# Patient Record
Sex: Female | Born: 1970 | Race: White | Hispanic: No | Marital: Married | Smoking: Former smoker
Health system: Southern US, Community
[De-identification: ages and names within clinical notes are randomized; demographics above are authoritative.]

## PROBLEM LIST (undated history)

## (undated) DIAGNOSIS — D179 Benign lipomatous neoplasm, unspecified: Secondary | ICD-10-CM

## (undated) DIAGNOSIS — M549 Dorsalgia, unspecified: Secondary | ICD-10-CM

## (undated) DIAGNOSIS — M1990 Unspecified osteoarthritis, unspecified site: Secondary | ICD-10-CM

## (undated) DIAGNOSIS — N200 Calculus of kidney: Secondary | ICD-10-CM

## (undated) DIAGNOSIS — N809 Endometriosis, unspecified: Secondary | ICD-10-CM

## (undated) DIAGNOSIS — D735 Infarction of spleen: Secondary | ICD-10-CM

## (undated) DIAGNOSIS — G8929 Other chronic pain: Secondary | ICD-10-CM

## (undated) DIAGNOSIS — K5710 Diverticulosis of small intestine without perforation or abscess without bleeding: Secondary | ICD-10-CM

## (undated) HISTORY — PX: OTHER SURGICAL HISTORY: SHX169

## (undated) HISTORY — PX: TUBAL LIGATION: SHX77

## (undated) HISTORY — DX: Calculus of kidney: N20.0

## (undated) HISTORY — PX: CHOLECYSTECTOMY: SHX55

## (undated) HISTORY — DX: Benign lipomatous neoplasm, unspecified: D17.9

## (undated) HISTORY — DX: Endometriosis, unspecified: N80.9

## (undated) HISTORY — DX: Diverticulosis of small intestine without perforation or abscess without bleeding: K57.10

## (undated) HISTORY — DX: Dorsalgia, unspecified: M54.9

## (undated) HISTORY — DX: Infarction of spleen: D73.5

## (undated) HISTORY — DX: Unspecified osteoarthritis, unspecified site: M19.90

## (undated) HISTORY — DX: Other chronic pain: G89.29

---

## 2003-06-09 ENCOUNTER — Encounter
Admission: RE | Admit: 2003-06-09 | Discharge: 2003-09-07 | Payer: Self-pay | Admitting: Physical Medicine and Rehabilitation

## 2003-08-10 ENCOUNTER — Ambulatory Visit (HOSPITAL_COMMUNITY): Admission: RE | Admit: 2003-08-10 | Discharge: 2003-08-10 | Payer: Self-pay | Admitting: Neurosurgery

## 2010-05-28 ENCOUNTER — Encounter: Payer: Self-pay | Admitting: Neurosurgery

## 2011-10-27 ENCOUNTER — Other Ambulatory Visit: Payer: Self-pay

## 2011-11-08 ENCOUNTER — Telehealth: Payer: Self-pay | Admitting: Oncology

## 2011-11-08 NOTE — Telephone Encounter (Signed)
S/w rhe pt and she is aware of her new pt appt on 11/16/2011 with dr Gaylyn Rong

## 2011-11-16 ENCOUNTER — Ambulatory Visit: Payer: Self-pay

## 2011-11-16 ENCOUNTER — Encounter: Payer: Self-pay | Admitting: Oncology

## 2011-11-16 ENCOUNTER — Other Ambulatory Visit (HOSPITAL_BASED_OUTPATIENT_CLINIC_OR_DEPARTMENT_OTHER): Payer: BC Managed Care – PPO | Admitting: Lab

## 2011-11-16 ENCOUNTER — Telehealth: Payer: Self-pay | Admitting: Oncology

## 2011-11-16 ENCOUNTER — Ambulatory Visit (HOSPITAL_BASED_OUTPATIENT_CLINIC_OR_DEPARTMENT_OTHER): Payer: BC Managed Care – PPO | Admitting: Oncology

## 2011-11-16 VITALS — BP 117/77 | HR 86 | Temp 98.3°F | Ht 61.0 in | Wt 182.8 lb

## 2011-11-16 DIAGNOSIS — D179 Benign lipomatous neoplasm, unspecified: Secondary | ICD-10-CM | POA: Insufficient documentation

## 2011-11-16 DIAGNOSIS — D7389 Other diseases of spleen: Secondary | ICD-10-CM

## 2011-11-16 DIAGNOSIS — D735 Infarction of spleen: Secondary | ICD-10-CM | POA: Insufficient documentation

## 2011-11-16 DIAGNOSIS — G8929 Other chronic pain: Secondary | ICD-10-CM

## 2011-11-16 DIAGNOSIS — M549 Dorsalgia, unspecified: Secondary | ICD-10-CM

## 2011-11-16 LAB — CBC WITH DIFFERENTIAL/PLATELET
Basophils Absolute: 0.1 10*3/uL (ref 0.0–0.1)
Eosinophils Absolute: 0.2 10*3/uL (ref 0.0–0.5)
HGB: 14.4 g/dL (ref 11.6–15.9)
MCV: 92.6 fL (ref 79.5–101.0)
NEUT#: 4.6 10*3/uL (ref 1.5–6.5)
RDW: 14 % (ref 11.2–14.5)
lymph#: 2.6 10*3/uL (ref 0.9–3.3)

## 2011-11-16 LAB — MORPHOLOGY: PLT EST: ADEQUATE

## 2011-11-16 MED ORDER — MORPHINE SULFATE ER 15 MG PO TBCR: 15.0000 mg | EXTENDED_RELEASE_TABLET | Freq: Two times a day (BID) | ORAL | Status: DC

## 2011-11-16 NOTE — Progress Notes (Signed)
[facility]  Telephone:([phone] Fax:(336) [phone]     INITIAL HEMATOLOGY CONSULTATION    Referral MD:   Dr. Fara Chute, M.D.  Reason for Referral: splenic infarct    HPI: [name] is a 41 year-old woman.  She was in usual state of health until Father's day 10/22/2011.  She developed transient acute spasm sensation of her substernal area that lasted for about a few minutes that resolved spontaneously.  She then developed progressive pain in the left flank.  Pain was crampy; sharp, severe 10/10; making her unable to perform household chores; persistent, no relation with eating/bowel movement/ activity.  She also had left shoulder pain which has seen resolved.  She waited until 6/ 21/2013 when the pain was unbearable.  She presented to [facility].  Her presenting WBC was 9.3; Hgb 14.3; plt 248.  Normal LFT, amylase and lipase.  Her INR was normal at 0.8.  Her D-dimer was elevated at 1.61 ug/mL with reference range for that lab was <0.49.  Her presenting EKG which I reviewed myself personally was normal sinus rhythm with rate at 68 bpm; normal intervals, normal axis, no ST-T wave changes.   CT angio did not show PE; however, it showed splenic infarct which was confirmed on CT abdomen and MRI abdomen.  I had the chance to review the CT and MRI abdomen myself.  There was splenic infarct in the posterior inferior pole of the spleen.  There was no splenemegaly, abdominal/pelvic adenopathy.  Liver, kidney, and pancreas appeared normal.  She was admitted overnight for pain controled.  She was evaluated by Dr. Erskine Speed who recommended conservative management.  She was discharged home on Percocet with follow up with Dr. Gabriel Cirri.  Her PCP Dr. Neita Carp kindly referred her to the Cancer Center for evaluation of potential hematologic cause of splenic infarct.   Andrea Gonzalez presented today in clinic with her husband and mother.  She still has 8/10 LUQ as  described above.  She needs to take Percocet about 4-6 times daily.  Percocet does abate her pain but it does not last very long.  She has fatigue due to the pain.  She denied sedation, constipation.  Over the last few days, she has had some intermittent sharp pain in the RUQ which is similar in quality but much less in intensity and sporadic.  She denied history of ever having thrombotic complication.   Patient denies fever, anorexia, weight loss, headache, visual changes, confusion, drenching night sweats, palpable lymph node swelling, mucositis, odynophagia, dysphagia, nausea vomiting, jaundice, chest pain, palpitation, shortness of breath, dyspnea on exertion, productive cough, gum bleeding, epistaxis, hematemesis, hemoptysis, abdominal swelling, early satiety, melena, hematochezia, hematuria, skin rash, spontaneous bleeding, joint swelling, joint pain, heat or cold intolerance, bowel bladder incontinence, back pain, focal motor weakness, paresthesia, depression, suicidal or homocidal ideation, feeling hopelessness.     Past Medical History  Diagnosis Date  . Lipoma     base of spine; on observation  . OA (osteoarthritis)   . Chronic back pain   . 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     Past Surgical History  Procedure Date  . Cholecystectomy   . Uterine ablation   . Right salpingo-oophorectomy     due to ectopic pregnancy; and hemrrhage  :   CURRENT MEDS: Current Outpatient Prescriptions  Medication Sig Dispense Refill  . oxyCODONE-acetaminophen (PERCOCET) 5-325 MG per tablet Take 1 tablet by mouth every 4 (four) hours as needed.  Up to max 10 per day      . zolpidem (AMBIEN) 10 MG tablet Take 10 mg by mouth at bedtime as needed.      Marland Kitchen morphine (MS CONTIN) 15 MG 12 hr tablet Take 1 tablet (15 mg total) by mouth 2 (two) times daily.  60 tablet  0      No Known Allergies:  History reviewed. No pertinent family history.:  History   Social History  . Marital  Status: Married    Spouse Name: N/A    Number of Children: 2  . Years of Education: N/A   Occupational History  .      self-employed   Social History Main Topics  . Smoking status: Not on file  . Smokeless tobacco: Not on file  . Alcohol Use: Not on file  . Drug Use: Not on file  . Sexually Active: Not on file   Other Topics Concern  . Not on file   Social History Narrative  . No narrative on file  :  REVIEW OF SYSTEM:  The rest of the 14-point review of sytem was negative.   Exam: ECOG 1 due to LUQ abdominal pain.   General: well nourished woman, slight distressed appearing due to LUQ pain.  She did not appear toxic or lethargic.  Eyes:  no scleral icterus.  ENT:  There were no oropharyngeal lesions.  Neck was without thyromegaly.  Lymphatics:  Negative cervical, supraclavicular or axillary adenopathy.  Respiratory: lungs were clear bilaterally without wheezing or crackles.  Cardiovascular:  Regular rate and rhythm, S1/S2, without murmur, rub or gallop.  There was no pedal edema.  GI:  abdomen was soft, mildly obese, no fluid wave, tender to palpation of LUQ toward the flank. There was no rebound tenderness.  Muscoloskeletal:  no spinal tenderness of palpation of vertebral spine.  Skin exam was without echymosis, petichae.  Neuro exam was nonfocal.  Patient was able to get on and off exam table without assistance.  Gait was normal.  Patient was alerted and oriented.  Attention was good.   Language was appropriate.  Mood was normal without depression.  Speech was not pressured.  Thought content was not tangential.    LABS:  Lab Results  Component Value Date   WBC 7.7 11/16/2011   HGB 14.4 11/16/2011   HCT 42.4 11/16/2011   PLT 212 11/16/2011   GLUCOSE 112* 11/16/2011   ALT 19 11/16/2011   AST 15 11/16/2011   NA 139 11/16/2011   K 4.0 11/16/2011   CL 108 11/16/2011   CREATININE 0.85 11/16/2011   BUN 12 11/16/2011   CO2 20 11/16/2011    Blood smear review:   I personally reviewed  the patient's peripheral blood smear today.  There was isocytosis.  There was no peripheral blast.  There was no schistocytosis, spherocytosis, target cell, rouleaux formation, tear drop cell, sickle cell.  There was no giant platelets or platelet clumps.    Echocardiogram from [facility] on 11/10/2011 showed normal LVEF; however with e/a reversal.  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  There was minimal MR and TR. There was no thrombus.    ASSESSMENT AND PLAN:   1.  Spontaneous Splenic infarct:  Despite symptoms of persistent pain, Hgb is stable.  There is no evidence of splenic rupture today on clinical history, exam, and Hgb.    - Differential:  Concern for potential cardiac cause given interatrial aneurysm.  From hematology standpoint, I have low  clinical suspicion for myeloproliferative disease such as polycythema vera, essential thrombocytosis, acute leukemia, chronic leukemia given normal CBC on 10/27/2011 and today and also on smear review.  There is no constitutional symptoms, adenopathy, or finding on abdominal CT and MRI to suspect lymphoproliferative disease.   Imaging study did not show occult malignancy.  Blood smear review, her normal Hgb and MCV make it unlikely for sickle cell disease.  I cannot rule out thrombophilia including lupus anticoagulant given her age/gender.  I cannot rule out paroxysmal nocturnal hemoglobinuria (PNH) either.  - Work up:   I sent for thrombophilia panel (as this may influence our decision for lifelong anticoag if positive).  I sent for PNH flow cytometry.  If these hem work ups turn out negative, I strongly recommend Dr. Neita Carp to consider cardiac evaluation.  For her RUQ, I cannot rule out new infarct.  However, the pain is still transient in the RUQ now.  She has follow up repeat abdominal MRI in a few days as per order by Dr. Gabriel Cirri.  I advised her to present to local ER with RUQ quadrant pain worsens.  - Treatment:  She takes  Percocet up to 6 times daily.  I recommended addition of long acting pain med with MS Contin 15mg  PO BID.  She should take less Percocet for breakthrough since she now has MS Contin.  I advised her to watch out for sedation.  I advised her on bowel regimen.   I will hold off on anticoagulation at this time due to lack of thrombus on CT/MRI.  We will address anticoagulation at next visit if she has indication.  - Follow up:  In about 2-3 weeks to go over hem work up.    Thank you for this referral.     The length of time of the encounter was 60 minutes. More than 50% of time was spent counseling and coordination of care.

## 2011-11-16 NOTE — Progress Notes (Signed)
Patient came in today as a new patient with her husband and she has [employer].I did explain to them the financial assistance and co-pay assistance program we offer here,but her husband said that they should be [place] as far as financial assistance with there insurance.

## 2011-11-16 NOTE — Telephone Encounter (Signed)
appts made and printed for pt aom °

## 2011-11-16 NOTE — Patient Instructions (Addendum)
1.  Splenic infarct - Potential causes:  Cardiac (atrial fibrillation; blood clot in heart chamber(s); vs thrombophilia (abnormal level of clot-breaking thrombolysis enzymes) vs PNH (paroxysmal nocturnal hemoglobinuria.  Low chance of myeloproliferative disease (due to normal blood count).  - work up:  Cardiology with your primary care physician.  Hemology work up here for thrombophilia and PNH.    2.  Treatment: - Addition of MS Contin (long acting) 15mg  by mouth TWICE DAILY in addition to Percocet (short acting) - Start Colace or Senna (Stool softerners) once to twice daily to prevent pain-med induced constipation. - If you have no bowel movement every 3 days, take some laxative (such as Miralax, Milk of magnesia, Mag citrate, or enema).  3.  Follow up in about 2-3  weeks to go over repeat abdominal MRI and need for blood thinner if new infarct or pain not significantly improved.

## 2011-11-17 ENCOUNTER — Other Ambulatory Visit: Payer: Self-pay | Admitting: Oncology

## 2011-11-17 DIAGNOSIS — D735 Infarction of spleen: Secondary | ICD-10-CM

## 2011-11-20 LAB — BETA-2 GLYCOPROTEIN ANTIBODIES: Beta-2-Glycoprotein I IgA: 36 A Units — ABNORMAL HIGH (ref <20)

## 2011-11-20 LAB — COMPREHENSIVE METABOLIC PANEL
AST: 15 U/L (ref 0–37)
Albumin: 4.3 g/dL (ref 3.5–5.2)
Alkaline Phosphatase: 65 U/L (ref 39–117)
Potassium: 4 mEq/L (ref 3.5–5.3)
Sodium: 139 mEq/L (ref 135–145)
Total Protein: 6.6 g/dL (ref 6.0–8.3)

## 2011-11-20 LAB — LUPUS ANTICOAGULANT PANEL
DRVVT: 38.5 secs (ref <45.1)
Lupus Anticoagulant: NOT DETECTED
PTT Lupus Anticoagulant: 32.6 secs (ref 28.0–43.0)

## 2011-11-20 LAB — CARDIOLIPIN ANTIBODIES, IGG, IGM, IGA
Anticardiolipin IgG: 3 GPL U/mL (ref <23)
Anticardiolipin IgM: 3 MPL U/mL (ref <11)

## 2011-11-20 LAB — PNH PROFILE (-HIGH SENSITIVITY): Viability (%): 70 %

## 2011-11-20 LAB — ANTITHROMBIN III: AntiThromb III Func: 106 % (ref 76–126)

## 2011-11-20 LAB — FACTOR 5 LEIDEN

## 2011-11-20 LAB — PROTHROMBIN GENE MUTATION

## 2011-12-06 ENCOUNTER — Ambulatory Visit (HOSPITAL_BASED_OUTPATIENT_CLINIC_OR_DEPARTMENT_OTHER): Payer: BC Managed Care – PPO | Admitting: Oncology

## 2011-12-06 VITALS — BP 113/80 | HR 90 | Temp 98.0°F | Ht 61.0 in | Wt 184.0 lb

## 2011-12-06 DIAGNOSIS — I748 Embolism and thrombosis of other arteries: Secondary | ICD-10-CM

## 2011-12-06 DIAGNOSIS — I729 Aneurysm of unspecified site: Secondary | ICD-10-CM

## 2011-12-06 DIAGNOSIS — D735 Infarction of spleen: Secondary | ICD-10-CM

## 2011-12-06 NOTE — Progress Notes (Signed)
[facility]  Telephone:(336) [phone] Fax:(336) [phone]   OFFICE PROGRESS NOTE   Cc:  Estanislado Pandy, MD  DIAGNOSIS:  Splenic infarct x 1, unknown cause.   CURRENT THERAPY:  Conservative management   INTERVAL HISTORY: Andrea Gonzalez 41 y.o. female returns for regular follow up with her husband and mother to go over test result.  Her LUQ pain has significantly improved over the last two weeks.  She no longer takes MS Contin or Percocet.  She has gone back to work.  She still has vague but very mild LUQ and RUQ pain at time.  There has been on obvious inciting or alleviating factors for the abdominal pain.    Patient denies fever, anorexia, weight loss, fatigue, headache, visual changes, confusion, drenching night sweats, palpable lymph node swelling, mucositis, odynophagia, dysphagia, nausea vomiting, jaundice, chest pain, palpitation, shortness of breath, dyspnea on exertion, productive cough, gum bleeding, epistaxis, hematemesis, hemoptysis, abdominal swelling, early satiety, melena, hematochezia, diarrhea, constipation, hematuria, skin rash, spontaneous bleeding, joint swelling, joint pain, heat or cold intolerance, bowel bladder incontinence, back pain, focal motor weakness, paresthesia, depression.    Past Medical History  Diagnosis Date  . Lipoma     base of spine; on observation  . OA (osteoarthritis)   . Chronic back pain   . 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 Endometriosis     Past Surgical History  Procedure Date  . Cholecystectomy   . Uterine ablation   . Right salpingo-oophorectomy     due to ectopic pregnancy; and hemrrhage    Current Outpatient Prescriptions  Medication Sig Dispense Refill  . Multiple Vitamin (MULTIVITAMIN) tablet Take 1 tablet by mouth daily.      Marland Kitchen morphine (MS CONTIN) 15 MG 12 hr tablet Take 1 tablet (15 mg total) by mouth 2 (two) times daily.  60 tablet  0  . oxyCODONE-acetaminophen (PERCOCET) 5-325 MG per tablet Take 1 tablet  by mouth every 4 (four) hours as needed. Up to max 10 per day      . zolpidem (AMBIEN) 10 MG tablet Take 10 mg by mouth at bedtime as needed.        ALLERGIES:   has no known allergies.  REVIEW OF SYSTEMS:  The rest of the 14-point review of system was negative.   Filed Vitals:   12/06/11 1453  BP: 113/80  Pulse: 90  Temp: 98 F (36.7 C)   Wt Readings from Last 3 Encounters:  12/06/11 184 lb (83.462 kg)  11/16/11 182 lb 12.8 oz (82.918 kg)   ECOG Performance status: 0  PHYSICAL EXAMINATION:   General:  well-nourished woman, in no acute distress.  Eyes:  no scleral icterus.  ENT:  There were no oropharyngeal lesions.  Neck was without thyromegaly.  Lymphatics:  Negative cervical, supraclavicular or axillary adenopathy.  Respiratory: lungs were clear bilaterally without wheezing or crackles.  Cardiovascular:  Regular rate and rhythm, S1/S2, without murmur, rub or gallop.  There was no pedal edema.  GI:  abdomen was soft, flat, nondistended, without organomegaly.  There was mild discomfort in the LUQ with palpation without rebound tenderness.  Muscoloskeletal:  no spinal tenderness of palpation of vertebral spine.  Skin exam was without echymosis, petichae.  Neuro exam was nonfocal.  Patient was able to get on and off exam table without assistance.  Gait was normal.  Patient was alerted and oriented.  Attention was good.   Language was appropriate.  Mood was normal without depression.  Speech was  not pressured.  Thought content was not tangential.     LABORATORY/RADIOLOGY DATA:  Lab Results  Component Value Date   WBC 7.7 11/16/2011   HGB 14.4 11/16/2011   HCT 42.4 11/16/2011   PLT 212 11/16/2011   GLUCOSE 112* 11/16/2011   ALKPHOS 65 11/16/2011   ALT 19 11/16/2011   AST 15 11/16/2011   NA 139 11/16/2011   K 4.0 11/16/2011   CL 108 11/16/2011   CREATININE 0.85 11/16/2011   BUN 12 11/16/2011   CO2 20 11/16/2011    ASSESSMENT AND PLAN:   1.  Splenic infarct: - Etiology:  Unclear.  There  is no obvious hematological/thrombophila cause for her splenic infarct.  Her PNH testing was negative.  She does have slightly elevated beta-2 glycoprotein IgA at 36 units (ref is <20).  However, her lupus anticoagulant was negative. Thus, the slightly positive beta-2 glycoprotein IgA is most likely due to false positive from acute inflammation.  I have low clinical suspicion for lupus antibody.  There was no evidence of lymphoproliferative disease on previous imaging.  Her repeat abdominal MRI on 11/22/2011 showed scar formation in the splenic infarct and no new infarct.  I personally reviewed the images on CD rom and compared the latest MRI to the one in 10/2011.    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  Interatrial septal aneurysm is associated with embolic stroke.  It is rarely reported in case report to cause splenic infarct.  I strongly recommend to Dr. Neita Carp to refer patient to cardiology to determine if this needs to be addressed.   - Treatment:  No indication at this time for patient to be on chronic anticoagulation to be on long term anticoagulation.  However, if the interatrial septal aneurysm is addressed, and she develops recurrent splenic infarct, then she may needs to be on lifelong anticoagulation.   - Patient education:  I educated patient, her relatives signs and symptoms of recurrent splenic infarct and thrombotic events elsewhere.  Patient and her relatives expressed informed understanding of the situation; and will make follow up visit with Dr. Neita Carp themselves.   - Follow up:  Discharged from [facility].  Our service is available in the future if the need arises.    Thank you for this referral.      The length of time of the face-to-face encounter was 15 minutes. More than 50% of time was spent counseling and coordination of care.

## 2011-12-27 ENCOUNTER — Ambulatory Visit: Payer: BC Managed Care – PPO | Admitting: Adult Health

## 2011-12-27 ENCOUNTER — Ambulatory Visit (INDEPENDENT_AMBULATORY_CARE_PROVIDER_SITE_OTHER): Payer: BC Managed Care – PPO | Admitting: Cardiology

## 2011-12-27 ENCOUNTER — Encounter: Payer: Self-pay | Admitting: Cardiology

## 2011-12-27 VITALS — BP 112/70 | HR 96 | Ht 61.0 in | Wt 180.0 lb

## 2011-12-27 DIAGNOSIS — R931 Abnormal findings on diagnostic imaging of heart and coronary circulation: Secondary | ICD-10-CM

## 2011-12-27 DIAGNOSIS — I253 Aneurysm of heart: Secondary | ICD-10-CM

## 2011-12-27 DIAGNOSIS — D7389 Other diseases of spleen: Secondary | ICD-10-CM

## 2011-12-27 DIAGNOSIS — D735 Infarction of spleen: Secondary | ICD-10-CM

## 2011-12-27 DIAGNOSIS — R9389 Abnormal findings on diagnostic imaging of other specified body structures: Secondary | ICD-10-CM

## 2011-12-27 NOTE — Assessment & Plan Note (Signed)
As noted above, no definite hypercoagulable state, with formal hematologic consultation per Dr. Gaylyn Rong. We are pursuing a transesophageal echocardiogram to better assess the atrial septal aneurysm, and I will also recommend a seven-day cardiac monitor to exclude the possibility of intermittent atrial fibrillation, that if documented would increase her risk for thromboembolic disease.

## 2011-12-27 NOTE — Progress Notes (Signed)
Clinical Summary Andrea Gonzalez is a 41 y.o.female referred for cardiology consultation by Dr. Neita Carp. Available records reviewed, including diagnosis of splenic infarct during hospitalization at [facility] back in June. She had presented at that time with acute onset left-sided abdominal pain following chest pain, splenic infarct documented by CT and ultimately MRI. She was seen in surgical consultation by Dr. Gabriel Cirri, overall conservative medical therapy recommended with pain control. She did have an elevated d-dimer, although no evidence of pulmonary embolus by CT angiogram. Anticardiolipin antibodies were negative. Troponin levels were normal. Based on the discharge summary, it is not clear that she was on telemetry or whether she had any significant arrhythmias documented. She was ultimately referred for an outpatient echocardiogram, done through [facility] and read by an outside service, that reports an intra-atrial aneurysm with no obvious shunt. ECG obtained in June showed sinus rhythm.  Further review of records finds that the patient was referred for hematological evaluation by Dr. Gaylyn Rong. I reviewed his most recent note from July, no definite hypercoagulable state documented, no definite lymphoproliferative disease, subsequent evidence of scar formation in the splenic infarct without progression based on repeat abdominal MRI in July. At this point she is being observed, no clear indication for anticoagulant therapies.  I spoke with the patient and her mother present today, and reviewed the records in detail. I unfortunately do not have the actual images of the echocardiogram done at [facility] for review, however with question of an atrial septal aneurysm raised, the possibility of right to left shunting is to be considered with possibility of a paradoxical embolus. It is also important to exclude paroxysmal atrial fibrillation that would increase her risk of left-sided intracardiac thrombus,  also evaluate the aorta to exclude any undue amount of atherosclerotic plaque that could have embolized.  From a symptom perspective, she states she has improved, no further left upper quadrant pain. She also denies any sense of palpitations or regular chest pain. No history of syncope, previous stroke.  No Known Allergies  Current Outpatient Prescriptions  Medication Sig Dispense Refill  . amoxicillin-clavulanate (AUGMENTIN) 500-125 MG per tablet Take 1 tablet by mouth 2 (two) times daily.       Marland Kitchen aspirin EC 81 MG tablet Take 81 mg by mouth daily.      Marland Kitchen morphine (MS CONTIN) 15 MG 12 hr tablet Take 15 mg by mouth as needed.      . Multiple Vitamin (MULTIVITAMIN) tablet Take 1 tablet by mouth daily.      Marland Kitchen oxyCODONE-acetaminophen (PERCOCET) 5-325 MG per tablet Take 1 tablet by mouth every 4 (four) hours as needed. Up to max 10 per day       . zolpidem (AMBIEN) 10 MG tablet Take 10 mg by mouth at bedtime as needed.        Past Medical History  Diagnosis Date  . Lipoma     Base of spine  . OA (osteoarthritis)   . Chronic back pain   . Splenic infarct     Hospitalized at [facility] 6/13  . Endometriosis   . Duodenal diverticulum   . Nephrolithiasis     Past Surgical History  Procedure Date  . Cholecystectomy   . Uterine ablation   . Right salpingo-oophorectomy     Due to ectopic pregnancy; and hemorrhage    Family History  Problem Relation Age of Onset  . Adopted: Yes    Social History Andrea Gonzalez reports that she quit smoking about 10 years ago. Her  smoking use included Cigarettes. She has a 10 pack-year smoking history. She does not have any smokeless tobacco history on file. Andrea Gonzalez reports that she drinks about 1.8 ounces of alcohol per week.  Review of Systems No fevers or chills, no abdominal pain. Stable appetite. No unusual weight changes. No rashes. No regular chest pain or palpitations, no syncope. No lower extremity edema. No arthralgias. Otherwise  negative.  Physical Examination Filed Vitals:   12/27/11 1120  BP: 112/70  Pulse: 96   Overweight woman in no acute distress. HEENT: Conjunctiva and lids normal, oropharynx clear with moist mucosa. Neck: Supple, no elevated JVP or carotid bruits, no thyromegaly. Lungs: Clear to auscultation, nonlabored breathing at rest. Cardiac: Regular rate and rhythm, no S3, very soft systolic murmur, no pericardial rub. Abdomen: Soft, nontender, bowel sounds present, no guarding or rebound. Extremities: No pitting edema, distal pulses 2+. Skin: Warm and dry. Tan. Musculoskeletal: No kyphosis. Neuropsychiatric: Alert and oriented x3, affect grossly appropriate.   ECG Sinus rhythm with decreased R wave progression.   Problem List and Plan   Atrial septal aneurysm Noted incidentally by outside echocardiogram done at [facility], images not available. No description of obvious shunting. Particularly in her case with a documented splenic infarct, it is important to further evaluate this as approximately 75% of the time there is some degree of right-to-left shunting associated with an atrial septal aneurysm. Also important to exclude any left-sided intracardiac thrombus, and further evaluate aorta for plaque burden. A surface echocardiogram with saline contrast study could be considered, although would not completely address these issues, therefore I have recommended a transesophageal echocardiogram for further evaluation. I discussed this with the patient and her mother, and she is in agreement to proceed. This is being scheduled through our [facility] practice with Dr. Andee Lineman.  Splenic infarct As noted above, no definite hypercoagulable state, with formal hematologic consultation per Dr. Gaylyn Rong. We are pursuing a transesophageal echocardiogram to better assess the atrial septal aneurysm, and I will also recommend a seven-day cardiac monitor to exclude the possibility of intermittent atrial fibrillation, that if  documented would increase her risk for thromboembolic disease.    Jonelle Sidle, M.D., F.A.C.C.

## 2011-12-27 NOTE — Assessment & Plan Note (Signed)
Noted incidentally by outside echocardiogram done at [facility], images not available. No description of obvious shunting. Particularly in her case with a documented splenic infarct, it is important to further evaluate this as approximately 75% of the time there is some degree of right-to-left shunting associated with an atrial septal aneurysm. Also important to exclude any left-sided intracardiac thrombus, and further evaluate aorta for plaque burden. A surface echocardiogram with saline contrast study could be considered, although would not completely address these issues, therefore I have recommended a transesophageal echocardiogram for further evaluation. I discussed this with the patient and her mother, and she is in agreement to proceed. This is being scheduled through our [facility] practice with Dr. Andee Lineman.

## 2011-12-27 NOTE — Patient Instructions (Addendum)
Your physician recommends that you schedule a follow-up appointment in: After testing  Your physician has requested that you have a TEE. During a TEE, sound waves are used to create images of your heart. It provides your doctor with information about the size and shape of your heart and how well your heart's chambers and valves are working. In this test, a transducer is attached to the end of a flexible tube that's guided down your throat and into your esophagus (the tube leading from you mouth to your stomach) to get a more detailed image of your heart. You are not awake for the procedure. Please see the instruction sheet given to you today. For further information please visit [email or web address]   Your physician has recommended that you wear an event monitor. Event monitors are medical devices that record the heart's electrical activity. Doctors most often [place] these monitors to diagnose arrhythmias. Arrhythmias are problems with the speed or rhythm of the heartbeat. The monitor is a small, portable device. You can wear one while you do your normal daily activities. This is usually used to diagnose what is causing palpitations/syncope (passing out).

## 2012-01-15 ENCOUNTER — Telehealth: Payer: Self-pay | Admitting: *Deleted

## 2012-01-15 NOTE — Telephone Encounter (Signed)
Yes please schedule in [place] with Dr. Shirlee Latch if possible.

## 2012-01-15 NOTE — Telephone Encounter (Signed)
Please see message below

## 2012-01-15 NOTE — Telephone Encounter (Signed)
Patient came in today for application of her 7 day Cardionet monitor.  Was to be scheduled for TEE by Dr Andee Lineman, upon returning from her vacation.  I advised her that he was no longer practicing with [facility] and that she would, more than likely have procedure done in [place], due to the nature of her medical issues.  Please advise as to what plan needs to be put into place.

## 2012-01-16 ENCOUNTER — Other Ambulatory Visit: Payer: Self-pay | Admitting: *Deleted

## 2012-01-16 ENCOUNTER — Telehealth: Payer: Self-pay | Admitting: *Deleted

## 2012-01-16 ENCOUNTER — Encounter: Payer: Self-pay | Admitting: *Deleted

## 2012-01-16 NOTE — Telephone Encounter (Signed)
Attempted to contact patient regarding TEE instructions for 01/30/2012.  Message left for return call.

## 2012-01-16 NOTE — Telephone Encounter (Signed)
LEFT MESSAGE IN [facility] THEY WILL CALL us BACK WITH APPT

## 2012-01-16 NOTE — Telephone Encounter (Signed)
Patient scheduled for 01/30/12 at @ 2:30 pm arrive at 1:30 (advised Tanya around 1:15).  Booking number [phone]   Will forward to Dr Shirlee Latch for orders to be put in system.

## 2012-01-17 NOTE — Telephone Encounter (Signed)
Patient contacted regarding instructions.  Verbalizes understanding.

## 2012-01-24 ENCOUNTER — Other Ambulatory Visit: Payer: Self-pay | Admitting: Cardiology

## 2012-01-24 DIAGNOSIS — R931 Abnormal findings on diagnostic imaging of heart and coronary circulation: Secondary | ICD-10-CM

## 2012-01-30 ENCOUNTER — Ambulatory Visit (HOSPITAL_COMMUNITY)
Admission: RE | Admit: 2012-01-30 | Discharge: 2012-01-30 | Disposition: A | Discharge status: Home / Self Care | Payer: BC Managed Care – PPO | Source: Ambulatory Visit | Attending: Cardiology | Admitting: Cardiology

## 2012-01-30 ENCOUNTER — Encounter (HOSPITAL_COMMUNITY): Payer: Self-pay

## 2012-01-30 ENCOUNTER — Encounter (HOSPITAL_COMMUNITY)
Admission: RE | Disposition: A | Discharge status: Home / Self Care | Payer: Self-pay | Source: Ambulatory Visit | Attending: Cardiology

## 2012-01-30 DIAGNOSIS — D7389 Other diseases of spleen: Secondary | ICD-10-CM | POA: Insufficient documentation

## 2012-01-30 DIAGNOSIS — I253 Aneurysm of heart: Secondary | ICD-10-CM

## 2012-01-30 DIAGNOSIS — Q211 Atrial septal defect: Secondary | ICD-10-CM

## 2012-01-30 DIAGNOSIS — D735 Infarction of spleen: Secondary | ICD-10-CM

## 2012-01-30 DIAGNOSIS — Q2111 Secundum atrial septal defect: Secondary | ICD-10-CM | POA: Insufficient documentation

## 2012-01-30 HISTORY — PX: TEE WITHOUT CARDIOVERSION: SHX5443

## 2012-01-30 SURGERY — ECHOCARDIOGRAM, TRANSESOPHAGEAL
Anesthesia: Moderate Sedation

## 2012-01-30 MED ORDER — MIDAZOLAM HCL 10 MG/2ML IJ SOLN
INTRAMUSCULAR | Status: DC | PRN
  Administered 2012-01-30 (×3): 2 mg via INTRAVENOUS
  Administered 2012-01-30: 1 mg via INTRAVENOUS

## 2012-01-30 MED ORDER — FENTANYL CITRATE 0.05 MG/ML IJ SOLN
INTRAMUSCULAR | Status: DC | PRN
  Administered 2012-01-30 (×3): 25 ug via INTRAVENOUS

## 2012-01-30 MED ORDER — FENTANYL CITRATE 0.05 MG/ML IJ SOLN
INTRAMUSCULAR | Status: AC
  Filled 2012-01-30: qty 2

## 2012-01-30 MED ORDER — MIDAZOLAM HCL 5 MG/ML IJ SOLN
INTRAMUSCULAR | Status: AC
  Filled 2012-01-30: qty 2

## 2012-01-30 MED ORDER — BUTAMBEN-TETRACAINE-BENZOCAINE 2-2-14 % EX AERO
INHALATION_SPRAY | CUTANEOUS | Status: DC | PRN
  Administered 2012-01-30: 2 via TOPICAL

## 2012-01-30 MED ORDER — SODIUM CHLORIDE 0.9 % IV SOLN: INTRAVENOUS | Status: DC

## 2012-01-30 NOTE — Progress Notes (Signed)
  Echocardiogram Echocardiogram Transesophageal has been performed.  Georgian Co 01/30/2012, 2:51 PM

## 2012-01-30 NOTE — H&P (Signed)
   Dr. Ival Bible note from 8/13 reviewed.  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  She is here today for a TEE to assess for source of embolus.  No changes to history and physical as documented by Dr. Diona Browner.  We will proceed with TEE.   Andrea Gonzalez 01/30/2012 2:17 PM

## 2012-01-30 NOTE — CV Procedure (Signed)
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   Sedation: 7 mg IV versed, 75 mcg IV Fentanyl.  Findings: Please see echo section of chart for full report.  Normal LV size and systolic function, EF 55-60%.  Normal RV size and systolic function.  No LAA thrombus.  No significant aortic plaque.  Borderline criteria for atrial septal aneurysm with small PFO seen by color doppler.  Bubbles crossed with valsalva.    No complications.   Patient will followup with Dr. Diona Browner.  At this time, ASA 81 mg daily is indicated.  Would question whether she would be a candidate for PFO closure trial.   Marca Ancona 01/30/2012 2:53 PM

## 2012-01-31 ENCOUNTER — Encounter (HOSPITAL_COMMUNITY): Payer: Self-pay | Admitting: Cardiology

## 2012-03-22 ENCOUNTER — Encounter: Payer: Self-pay | Admitting: Cardiology

## 2012-03-22 ENCOUNTER — Ambulatory Visit (INDEPENDENT_AMBULATORY_CARE_PROVIDER_SITE_OTHER): Payer: BC Managed Care – PPO | Admitting: Cardiology

## 2012-03-22 VITALS — BP 140/74 | HR 80 | Ht 61.0 in | Wt 187.0 lb

## 2012-03-22 DIAGNOSIS — Q211 Atrial septal defect: Secondary | ICD-10-CM

## 2012-03-22 DIAGNOSIS — I253 Aneurysm of heart: Secondary | ICD-10-CM

## 2012-03-22 NOTE — Assessment & Plan Note (Signed)
Small, documented by TEE with color Doppler and bubble study. As noted above, plan is to refer her to Dr. Excell Seltzer for further discussion regarding whether it would be at all reasonable to consider a catheter-based closure device, or perhaps even a trial employing this type of technology. Otherwise she will continue aspirin.

## 2012-03-22 NOTE — Assessment & Plan Note (Signed)
Noted by surface echocardiogram.

## 2012-03-22 NOTE — Progress Notes (Signed)
   Clinical Summary Andrea Gonzalez is a 42 y.o.female presenting for office followup. She was seen in August in consultation for finding of an atrial septal aneurysm by echocardiogram with history of splenic infarct, and negative hypercoagulable workup. Refer to previous note for details, also notes by hematology.  She was referred for a TEE which demonstrated no evidence of left or right atrial appendage thrombus, and small PFO noted by color Doppler and bubble study. Aspirin was recommended at that time. She also wore a cardiac monitor that did not demonstrate any definite arrhythmias.  She is here with her husband today. I reviewed the findings of the TEE and answered their questions. Patient denies any significant symptomatology, states that she has been taking her aspirin regularly.  We did discuss other potential options for management including possibility of catheter-based closure devices, also possible trials involving this type of treatment. I contacted Dr. Excell Seltzer in our practice to arrange a visit with him for consultation.  No Known Allergies  Current Outpatient Prescriptions  Medication Sig Dispense Refill  . aspirin EC 81 MG tablet Take 81 mg by mouth daily.      . Multiple Vitamin (MULTIVITAMIN) tablet Take 1 tablet by mouth daily.        Past Medical History  Diagnosis Date  . Lipoma     Base of spine  . OA (osteoarthritis)   . Chronic back pain   . Splenic infarct     Hospitalized at [facility] 6/13  . Endometriosis   . Duodenal diverticulum   . Nephrolithiasis     Social History Ms. Dreese reports that she quit smoking about 10 years ago. Her smoking use included Cigarettes. She has a 10 pack-year smoking history. She does not have any smokeless tobacco history on file. Ms. Fana reports that she drinks about 1.8 ounces of alcohol per week.  Review of Systems No palpitations, no abdominal pain, no chest pain.  Physical Examination Filed Vitals:   03/22/12  0931  BP: 140/74  Pulse: 80   Filed Weights   03/22/12 0931  Weight: 187 lb (84.823 kg)    Patient was not reexamined today. We spent the entire 20 minute visit discussing test results and plans for treatment as well as consultation.   Problem List and Plan   PFO (patent foramen ovale) Small, documented by TEE with color Doppler and bubble study. As noted above, plan is to refer her to Dr. Excell Seltzer for further discussion regarding whether it would be at all reasonable to consider a catheter-based closure device, or perhaps even a trial employing this type of technology. Otherwise she will continue aspirin.  Atrial septal aneurysm Noted by surface echocardiogram.    Jonelle Sidle, M.D., F.A.C.C.

## 2012-04-08 ENCOUNTER — Encounter: Payer: Self-pay | Admitting: Cardiovascular Disease

## 2012-04-08 ENCOUNTER — Ambulatory Visit (INDEPENDENT_AMBULATORY_CARE_PROVIDER_SITE_OTHER): Payer: BC Managed Care – PPO | Admitting: Cardiovascular Disease

## 2012-04-08 VITALS — BP 130/80 | HR 89 | Ht 61.0 in | Wt 184.0 lb

## 2012-04-08 DIAGNOSIS — Q211 Atrial septal defect: Secondary | ICD-10-CM

## 2012-04-08 NOTE — Progress Notes (Signed)
HPI:  41 year-old woman presenting for evaluation of a patent foramen ovale. Her history dates back to June 2013 when she presented with acute abdominal pain. She was diagnosed with a splenic infarct. Apparently, as part of her evaluation she had a 2-D echocardiogram performed. This demonstrated an atrial septal aneurysm. She underwent hematologic evaluation and there was no evidence of a hypercoagulable state. She underwent a TEE that demonstrated a PFO with right-to-left shunting. There was no evidence of aortic atheroma or left atrial appendage thrombus. She worked cardiac monitor that showed no arrhythmias.  She has no cardiac symptoms and specifically denies chest pain, dyspnea, or palpitations. She has never had stroke or TIA symptoms. She is tolerating ASA without problems. She has rare headaches, but no history of migraines.  Outpatient Encounter Prescriptions as of 04/08/2012  Medication Sig Dispense Refill  . aspirin EC 81 MG tablet Take 81 mg by mouth daily.      . Multiple Vitamin (MULTIVITAMIN) tablet Take 1 tablet by mouth daily.        Review of patient's allergies indicates no known allergies.  Past Medical History  Diagnosis Date  . Lipoma     Base of spine  . OA (osteoarthritis)   . Chronic back pain   . Splenic infarct     Hospitalized at [facility] 6/13  . Endometriosis   . Duodenal diverticulum   . Nephrolithiasis     Past Surgical History  Procedure Date  . Cholecystectomy   . Uterine ablation   . Right salpingo-oophorectomy     Due to ectopic pregnancy; and hemorrhage  . Tee without cardioversion 01/30/2012    Procedure: TRANSESOPHAGEAL ECHOCARDIOGRAM (TEE);  Surgeon: Laurey Morale, MD;  Location: [facility] ENDOSCOPY;  Service: Cardiovascular;  Laterality: N/A;    History   Social History  . Marital Status: Married    Spouse Name: N/A    Number of Children: 2  . Years of Education: N/A   Occupational History  .      Self-employed   Social History Main  Topics  . Smoking status: Former Smoker -- 1.0 packs/day for 10 years    Types: Cigarettes    Quit date: 05/08/2001  . Smokeless tobacco: Not on file  . Alcohol Use: 1.8 oz/week    3 Glasses of wine per week     Comment: Social  . Drug Use: No  . Sexually Active: Not on file   Other Topics Concern  . Not on file   Social History Narrative  . No narrative on file    Family History  Problem Relation Age of Onset  . Adopted: Yes   ROS: General: no fevers/chills/night sweats Eyes: no blurry vision, diplopia, or amaurosis ENT: no sore throat or hearing loss Resp: no cough, wheezing, or hemoptysis CV: no edema or palpitations GI: no abdominal pain, nausea, vomiting, diarrhea, or constipation GU: no dysuria, frequency, or hematuria Skin: no rash Neuro: negative for numbness, tingling, or weakness of extremities Musculoskeletal: no joint pain or swelling Heme: no bleeding, DVT, or easy bruising Endo: no polydipsia or polyuria  BP 130/80  Pulse 89  Ht 5\' 1"  (1.549 m)  Wt 83.462 kg (184 lb)  BMI 34.77 kg/m2  SpO2 99%  PHYSICAL EXAM: Pt is alert and oriented, WD, WN, in no distress. HEENT: normal Neck: JVP normal. Carotid upstrokes normal without bruits. No thyromegaly. Lungs: equal expansion, clear bilaterally CV: Apex is discrete and nondisplaced, RRR without murmur or gallop Abd: soft, NT, +  BS, no bruit, no hepatosplenomegaly Back: no CVA tenderness Ext: no C/C/E        Femoral pulses 2+= without bruits        DP/PT pulses intact and = Skin: warm and dry without rash Neuro: CNII-XII intact             Strength intact = bilaterally  TEE 01/30/2012: Study Conclusions  - Left ventricle: The cavity size was normal. Wall thickness was normal. Systolic function was normal. The estimated ejection fraction was in the range of 55% to 60%. Wall motion was normal; there were no regional wall motion abnormalities. - Aortic valve: There was no stenosis. - Aorta: Normal  caliber with minimal plaque. - Mitral valve: No significant regurgitation. - Left atrium: The atrium was mildly dilated. No evidence of thrombus in the atrial cavity or appendage. No evidence of thrombus in the atrial cavity or appendage. - Right ventricle: The cavity size was normal. Systolic function was normal. - Right atrium: No evidence of thrombus in the atrial cavity or appendage. - Atrial septum: There was a small PFO noted by color doppler and by bubble study. Impressions:  - Normal study except for PFO.  ASSESSMENT AND PLAN: PFO with history of splenic infarct. Suspicious clinical history for paradoxical embolus. Workup is complete with negative hypercoagulable state. We had a long discussion today about the implications of her PFO specifically in the context of her history. I have personally reviewed her TEE images and I do not think she meets criteria for atrial septal aneurysm even though this was reported on her surface echo (images unavailable for review). We discussed the fact that there is no evidence to direct treatment for this specific clinical scenario. However, I think the TIA/stroke data is applicable. There is no compelling evidence for PFO closure in a patient who was not on ASA or other antiplatelet/anticoagulant Rx at the time of the event. Randomized trials have showed numerically fewer event rates in patients who have undergone PFO closure, but statistical significance has never been met. In an absence of high-risk anatomic features (atrial septal aneurysm/large defect), I would favor continued treatment with ASA. All of her questions were answered. She understands that she needs to avoid anything that could potentially increase coagulability such as hormone replacement Rx. I would be happy to see her back anytime in the future, and I would consider closure of her defect if she has an event on aspirin. We spent 45 minutes today in review of data and direct  discussion.  Tonny Bollman

## 2012-04-08 NOTE — Patient Instructions (Addendum)
Your physician recommends that you continue on your current medications as directed. Please refer to the Current Medication list given to you today.  Your physician recommends that you schedule a follow-up appointment in: as needed  

## 2012-04-09 ENCOUNTER — Ambulatory Visit: Payer: BC Managed Care – PPO | Admitting: Cardiovascular Disease

## 2012-04-19 ENCOUNTER — Encounter: Payer: Self-pay | Admitting: Cardiovascular Disease

## 2015-06-18 ENCOUNTER — Emergency Department (HOSPITAL_COMMUNITY)
Admission: EM | Admit: 2015-06-18 | Discharge: 2015-06-18 | Disposition: A | Discharge status: Home / Self Care | Payer: BLUE CROSS/BLUE SHIELD | Attending: Emergency Medicine | Admitting: Emergency Medicine

## 2015-06-18 ENCOUNTER — Encounter (HOSPITAL_COMMUNITY): Payer: Self-pay | Admitting: Emergency Medicine

## 2015-06-18 ENCOUNTER — Emergency Department (HOSPITAL_COMMUNITY): Payer: BLUE CROSS/BLUE SHIELD

## 2015-06-18 DIAGNOSIS — M1990 Unspecified osteoarthritis, unspecified site: Secondary | ICD-10-CM | POA: Insufficient documentation

## 2015-06-18 DIAGNOSIS — R1011 Right upper quadrant pain: Secondary | ICD-10-CM

## 2015-06-18 DIAGNOSIS — Z87891 Personal history of nicotine dependence: Secondary | ICD-10-CM | POA: Insufficient documentation

## 2015-06-18 DIAGNOSIS — G8929 Other chronic pain: Secondary | ICD-10-CM | POA: Insufficient documentation

## 2015-06-18 DIAGNOSIS — Z8719 Personal history of other diseases of the digestive system: Secondary | ICD-10-CM | POA: Insufficient documentation

## 2015-06-18 DIAGNOSIS — R1013 Epigastric pain: Secondary | ICD-10-CM | POA: Insufficient documentation

## 2015-06-18 DIAGNOSIS — Z8742 Personal history of other diseases of the female genital tract: Secondary | ICD-10-CM | POA: Diagnosis not present

## 2015-06-18 DIAGNOSIS — R109 Unspecified abdominal pain: Secondary | ICD-10-CM | POA: Diagnosis present

## 2015-06-18 DIAGNOSIS — Z87442 Personal history of urinary calculi: Secondary | ICD-10-CM | POA: Insufficient documentation

## 2015-06-18 DIAGNOSIS — Z862 Personal history of diseases of the blood and blood-forming organs and certain disorders involving the immune mechanism: Secondary | ICD-10-CM | POA: Diagnosis not present

## 2015-06-18 DIAGNOSIS — Z85828 Personal history of other malignant neoplasm of skin: Secondary | ICD-10-CM | POA: Diagnosis not present

## 2015-06-18 LAB — CBC
HEMATOCRIT: 45 % (ref 36.0–46.0)
HEMOGLOBIN: 15.3 g/dL — AB (ref 12.0–15.0)
MCH: 32.3 pg (ref 26.0–34.0)
MCHC: 34 g/dL (ref 30.0–36.0)
MCV: 94.9 fL (ref 78.0–100.0)
Platelets: 283 10*3/uL (ref 150–400)
RBC: 4.74 MIL/uL (ref 3.87–5.11)
RDW: 13.9 % (ref 11.5–15.5)
WBC: 9.4 10*3/uL (ref 4.0–10.5)

## 2015-06-18 LAB — LIPASE, BLOOD: Lipase: 25 U/L (ref 11–51)

## 2015-06-18 LAB — COMPREHENSIVE METABOLIC PANEL
ALBUMIN: 3.7 g/dL (ref 3.5–5.0)
ALK PHOS: 74 U/L (ref 38–126)
ALT: 43 U/L (ref 14–54)
AST: 34 U/L (ref 15–41)
Anion gap: 10 (ref 5–15)
BILIRUBIN TOTAL: 0.2 mg/dL — AB (ref 0.3–1.2)
BUN: 10 mg/dL (ref 6–20)
CALCIUM: 9.2 mg/dL (ref 8.9–10.3)
CO2: 24 mmol/L (ref 22–32)
CREATININE: 0.83 mg/dL (ref 0.44–1.00)
Chloride: 107 mmol/L (ref 101–111)
GFR calc Af Amer: >60 mL/min (ref >60)
GFR calc non Af Amer: >60 mL/min (ref >60)
GLUCOSE: 111 mg/dL — AB (ref 65–99)
Potassium: 4.6 mmol/L (ref 3.5–5.1)
Sodium: 141 mmol/L (ref 135–145)
TOTAL PROTEIN: 6.9 g/dL (ref 6.5–8.1)

## 2015-06-18 LAB — URINALYSIS, ROUTINE W REFLEX MICROSCOPIC
BILIRUBIN URINE: NEGATIVE
Glucose, UA: NEGATIVE mg/dL
KETONES UR: NEGATIVE mg/dL
Leukocytes, UA: NEGATIVE
Nitrite: NEGATIVE
PH: 5 (ref 5.0–8.0)
Protein, ur: NEGATIVE mg/dL
SPECIFIC GRAVITY, URINE: 1.03 (ref 1.005–1.030)

## 2015-06-18 LAB — URINE MICROSCOPIC-ADD ON
RBC / HPF: NONE SEEN RBC/hpf (ref 0–5)
WBC, UA: NONE SEEN WBC/hpf (ref 0–5)

## 2015-06-18 MED ORDER — MORPHINE SULFATE (PF) 4 MG/ML IV SOLN
4.0000 mg | Freq: Once | INTRAVENOUS | Status: AC
  Administered 2015-06-18: 4 mg via INTRAVENOUS
  Filled 2015-06-18: qty 1

## 2015-06-18 MED ORDER — IBUPROFEN 200 MG PO TABS: 800.0000 mg | ORAL_TABLET | Freq: Three times a day (TID) | ORAL | Status: DC | PRN

## 2015-06-18 MED ORDER — IOHEXOL 300 MG/ML  SOLN
100.0000 mL | Freq: Once | INTRAMUSCULAR | Status: AC | PRN
  Administered 2015-06-18: 100 mL via INTRAVENOUS

## 2015-06-18 MED ORDER — SODIUM CHLORIDE 0.9 % IV BOLUS (SEPSIS)
1000.0000 mL | Freq: Once | INTRAVENOUS | Status: AC
  Administered 2015-06-18: 1000 mL via INTRAVENOUS

## 2015-06-18 MED ORDER — OXYCODONE-ACETAMINOPHEN 5-325 MG PO TABS
1.0000 | ORAL_TABLET | Freq: Once | ORAL | Status: AC
  Administered 2015-06-18: 1 via ORAL

## 2015-06-18 MED ORDER — KETOROLAC TROMETHAMINE 15 MG/ML IJ SOLN
15.0000 mg | Freq: Once | INTRAMUSCULAR | Status: AC
  Administered 2015-06-18: 15 mg via INTRAVENOUS
  Filled 2015-06-18: qty 1

## 2015-06-18 MED ORDER — OXYCODONE-ACETAMINOPHEN 5-325 MG PO TABS
ORAL_TABLET | ORAL | Status: AC
  Filled 2015-06-18: qty 1

## 2015-06-18 NOTE — ED Notes (Signed)
C/o abd pain for 2 days no n v or diarrhea.

## 2015-06-18 NOTE — Discharge Instructions (Signed)

## 2015-06-18 NOTE — ED Notes (Signed)
Pt reports abd pain x 2 days. Pt reports that she has a hx of a blood clot to her spleen. Pt alert x4. NAD at this time.

## 2015-06-18 NOTE — ED Provider Notes (Signed)
CSN: [id number]     Arrival date & time 06/18/15  1324 History   First MD Initiated Contact with Patient 06/18/15 1731     Chief Complaint  Patient presents with  . Abdominal Pain     (Consider location/radiation/quality/duration/timing/severity/associated sxs/prior Treatment) Patient is a 45 y.o. female presenting with abdominal pain.  Abdominal Pain Pain location:  R flank Pain quality: aching   Pain severity:  Severe Onset quality:  Gradual Duration:  2 days Timing:  Constant Progression:  Worsening Chronicity: similar to prior pain on left when she had clot in her spleen. Relieved by:  None tried Worsened by:  Movement Associated symptoms: no anorexia, no chest pain, no chills, no constipation, no cough, no diarrhea, no fatigue, no fever, no nausea, no shortness of breath, no sore throat and no vomiting     Past Medical History  Diagnosis Date  . Lipoma     Base of spine  . OA (osteoarthritis)   . Chronic back pain   . Splenic infarct     Hospitalized at [facility] 6/13  . Endometriosis   . Duodenal diverticulum   . Nephrolithiasis    Past Surgical History  Procedure Laterality Date  . Cholecystectomy    . Uterine ablation    . Right salpingo-oophorectomy      Due to ectopic pregnancy; and hemorrhage  . Tee without cardioversion  01/30/2012    Procedure: TRANSESOPHAGEAL ECHOCARDIOGRAM (TEE);  Surgeon: Larey Dresser, MD;  Location: [facility] ENDOSCOPY;  Service: Cardiovascular;  Laterality: N/A;   Family History  Problem Relation Age of Onset  . Adopted: Yes   Social History  Substance Use Topics  . Smoking status: Former Smoker -- 1.00 packs/day for 10 years    Types: Cigarettes    Quit date: 05/08/2001  . Smokeless tobacco: None  . Alcohol Use: 1.8 oz/week    3 Glasses of wine per week     Comment: Social   OB History    No data available     Review of Systems  Constitutional: Negative for fever, chills, appetite change and fatigue.  HENT: Negative for  congestion, ear pain, facial swelling, mouth sores and sore throat.   Eyes: Negative for visual disturbance.  Respiratory: Negative for cough, chest tightness and shortness of breath.   Cardiovascular: Negative for chest pain and palpitations.  Gastrointestinal: Positive for abdominal pain. Negative for nausea, vomiting, diarrhea, constipation, blood in stool and anorexia.  Endocrine: Negative for cold intolerance and heat intolerance.  Genitourinary: Negative for frequency, decreased urine volume and difficulty urinating.  Musculoskeletal: Negative for back pain and neck stiffness.  Skin: Negative for rash.  Neurological: Negative for dizziness, weakness, light-headedness and headaches.  All other systems reviewed and are negative.     Allergies  Review of patient's allergies indicates no known allergies.  Home Medications   Prior to Admission medications   Medication Sig Start Date End Date Taking? Authorizing Provider  acetaminophen (TYLENOL) 500 MG tablet Take 500 mg by mouth every 6 (six) hours as needed for mild pain.   Yes Historical Provider, MD  ibuprofen (ADVIL,MOTRIN) 200 MG tablet Take 4 tablets (800 mg total) by mouth every 8 (eight) hours as needed. 06/18/15   Addison Lank, MD   BP 128/81 mmHg  Pulse 90  Temp(Src) 98.2 F (36.8 C) (Oral)  Resp 18  SpO2 97%  LMP 05/25/2015 (Approximate) Physical Exam  Constitutional: She is oriented to person, place, and time. She appears well-developed and well-nourished. No distress.  HENT:  Head: Normocephalic and atraumatic.  Right Ear: External ear normal.  Left Ear: External ear normal.  Nose: Nose normal.  Eyes: Conjunctivae and EOM are normal. Pupils are equal, round, and reactive to light. Right eye exhibits no discharge. Left eye exhibits no discharge. No scleral icterus.  Neck: Normal range of motion. Neck supple.  Cardiovascular: Normal rate, regular rhythm and normal heart sounds.  Exam reveals no gallop and no  friction rub.   No murmur heard. Pulmonary/Chest: Effort normal and breath sounds normal. No stridor. No respiratory distress. She has no wheezes.  Abdominal: Soft. Normal appearance. She exhibits no distension, no ascites and no mass. There is tenderness in the right upper quadrant and epigastric area. There is no rebound and no CVA tenderness. No hernia.  Musculoskeletal: She exhibits no edema or tenderness.  Neurological: She is alert and oriented to person, place, and time.  Skin: Skin is warm and dry. No rash noted. She is not diaphoretic. No erythema.  Psychiatric: She has a normal mood and affect.    ED Course  Procedures (including critical care time) Labs Review Labs Reviewed  COMPREHENSIVE METABOLIC PANEL - Abnormal; Notable for the following:    Glucose, Bld 111 (*)    Total Bilirubin 0.2 (*)    All other components within normal limits  CBC - Abnormal; Notable for the following:    Hemoglobin 15.3 (*)    All other components within normal limits  URINALYSIS, ROUTINE W REFLEX MICROSCOPIC (NOT AT [facility]) - Abnormal; Notable for the following:    APPearance CLOUDY (*)    Hgb urine dipstick TRACE (*)    All other components within normal limits  URINE MICROSCOPIC-ADD ON - Abnormal; Notable for the following:    Squamous Epithelial / LPF 6-30 (*)    Bacteria, UA FEW (*)    All other components within normal limits  LIPASE, BLOOD  HCG, QUANTITATIVE, PREGNANCY    Imaging Review Dg Chest 2 View  06/18/2015  CLINICAL DATA:  RIGHT upper quadrant pain onset 1 day ago. Pain increases with deep breath. EXAM: CHEST  2 VIEW COMPARISON:  None. FINDINGS: The heart size and mediastinal contours are within normal limits. Both lungs are clear. The visualized skeletal structures are unremarkable. IMPRESSION: No active cardiopulmonary disease. Electronically Signed   By: Staci Righter M.D.   On: 06/18/2015 19:04   Ct Abdomen Pelvis W Contrast  06/18/2015  CLINICAL DATA:  45 year old  presenting with acute onset of right upper quadrant abdominal pain 2 days ago. Surgical history includes cholecystectomy, endometrial of correlation and right salpingo-oophorectomy. EXAM: CT ABDOMEN AND PELVIS WITH CONTRAST TECHNIQUE: Multidetector CT imaging of the abdomen and pelvis was performed using the standard protocol following bolus administration of intravenous contrast. CONTRAST:  142mL OMNIPAQUE IOHEXOL 300 MG/ML IV. Oral contrast was not administered. COMPARISON:  None. FINDINGS: Lower chest: Linear scar or atelectasis in the left lower lobe. Visualized lung bases otherwise clear. Normal heart size. Hepatobiliary: Diffuse hepatic steatosis without focal hepatic parenchymal abnormality. Gallbladder surgically absent. No biliary ductal dilation. Pancreas: Normal in appearance without evidence of mass, ductal dilation, or inflammation. Spleen: Normal in size and appearance. Adrenals/Urinary Tract: Normal appearing adrenal glands. Kidneys normal in size and appearance without focal parenchymal abnormality. No evidence of urinary tract calculi or obstruction. Normal-appearing decompressed urinary bladder. Stomach/Bowel: Very small hiatal hernia. Stomach otherwise normal in appearance. Normal-appearing small bowel. Entire colon relatively decompressed with moderate stool burden. No evidence of colonic diverticulosis. Mobile cecum in the  scratch they completely decompressed cecum positioned in the right mid abdomen. Normal appendix in the right mid abdomen and right upper pelvis. Vascular/Lymphatic: No visible aortoiliofemoral atherosclerosis. Widely patent visceral arteries. Normal-appearing portal venous and iliocaval venous systems. No pathologic lymphadenopathy. Reproductive: Normal-appearing uterus and ovaries without evidence of adnexal mass. Other: None. Musculoskeletal: Degenerative disc disease and spondylosis involving the lower thoracic spine. Severe degenerative disc disease at L5-S1. No acute  abnormality. IMPRESSION: 1. No acute abnormalities involving the abdomen or pelvis. Moderate colonic stool burden. 2. Diffuse hepatic steatosis without focal hepatic parenchymal abnormality. 3. Very small hiatal hernia. Electronically Signed   By: Evangeline Dakin M.D.   On: 06/18/2015 20:39   I have personally reviewed and evaluated these images and lab results as part of my medical decision-making.   EKG Interpretation None      MDM   45 year old female with a history of spontaneous splenic infarct presents the ED with 2 days of right upper quadrant pain. This is similar to her splenic infarct pain. No other physical complaints.  Last obtained revealed no evidence of pancreatitis. LFTs within normal limits. No electrolyte derangements. CBC without leukocytosis. Hemoglobin elevated at 15.3. UA with out evidence of UTI. He does have trace hemoglobin. CT obtained to assess for intra-abdominal pathology revealed moderate colonic stool burden and small hiatal hernia otherwise no acute abnormalities.  Patient provided with symptomatic pain medication and IV fluids.  No emergent etiology identified during her workup.  Patient safe for discharge with strict return precautions. Patient follow-up with PCP in 3-5 days if her symptoms worsen or do not improve.  Final diagnoses:  RUQ abdominal pain    Patient seen in conjunction with Dr. Rogene Houston.  Addison Lank, MD Resident     Addison Lank, MD 06/18/15 [id number]  Fredia Sorrow, MD 06/18/15 2118

## 2015-06-18 NOTE — ED Notes (Signed)
Taken to CT at this time. 

## 2016-07-26 ENCOUNTER — Encounter (INDEPENDENT_AMBULATORY_CARE_PROVIDER_SITE_OTHER): Payer: Self-pay

## 2016-07-26 ENCOUNTER — Encounter (INDEPENDENT_AMBULATORY_CARE_PROVIDER_SITE_OTHER): Payer: Self-pay | Admitting: Internal Medicine

## 2016-08-07 ENCOUNTER — Ambulatory Visit (INDEPENDENT_AMBULATORY_CARE_PROVIDER_SITE_OTHER): Payer: BLUE CROSS/BLUE SHIELD | Admitting: Internal Medicine

## 2016-08-07 ENCOUNTER — Encounter (INDEPENDENT_AMBULATORY_CARE_PROVIDER_SITE_OTHER): Payer: Self-pay | Admitting: Internal Medicine

## 2016-08-07 ENCOUNTER — Encounter (INDEPENDENT_AMBULATORY_CARE_PROVIDER_SITE_OTHER): Payer: Self-pay | Admitting: *Deleted

## 2016-08-07 VITALS — BP 160/110 | HR 84 | Temp 97.7°F | Ht 61.0 in | Wt 233.1 lb

## 2016-08-07 DIAGNOSIS — R748 Abnormal levels of other serum enzymes: Secondary | ICD-10-CM | POA: Diagnosis not present

## 2016-08-07 LAB — HEPATIC FUNCTION PANEL
ALBUMIN: 4.2 g/dL (ref 3.6–5.1)
ALK PHOS: 285 U/L — AB (ref 33–115)
ALT: 170 U/L — AB (ref 6–29)
AST: 191 U/L — AB (ref 10–35)
BILIRUBIN TOTAL: 1.3 mg/dL — AB (ref 0.2–1.2)
Bilirubin, Direct: 0.6 mg/dL — ABNORMAL HIGH (ref <=0.2)
Indirect Bilirubin: 0.7 mg/dL (ref 0.2–1.2)
TOTAL PROTEIN: 7 g/dL (ref 6.1–8.1)

## 2016-08-07 NOTE — Progress Notes (Addendum)
   Subjective:    Patient ID: Andrea Gonzalez, female    DOB: [birth year], 46 y.o.   MRN: [id number]  HPI Referred by Dr. Quintin Alto for elevated liver enzymes. Cholecystectomy years ago for gallstones.  No hx of illegal IV drugs. No tattoos. Acute hepatitis panel is negative.  Appetite is good. No abdominal pain.  CMET normal in February 2017  07/19/2016 H and H 15.4 and 45.8, MCV 100, platelet ct 292., total bili 0.9, AST 283, ALT 239, ALP 285. Acute Hepatitis panel negative.  07/19/2016 CT abdomen/pelvis with CM: back pain x 4 days. Hx of kidney stones: Hepatic steatosis and hepatomegaly. No acute process in the abdomen or pelvis.    Review of Systems Past Medical History:  Diagnosis Date  . Chronic back pain   . Duodenal diverticulum   . Endometriosis   . Lipoma    Base of spine  . Nephrolithiasis   . OA (osteoarthritis)   . Splenic infarct    Hospitalized at [facility] 6/13    Past Surgical History:  Procedure Laterality Date  . CHOLECYSTECTOMY    . Right salpingo-oophorectomy     Due to ectopic pregnancy; and hemorrhage  . TEE WITHOUT CARDIOVERSION  01/30/2012   Procedure: TRANSESOPHAGEAL ECHOCARDIOGRAM (TEE);  Surgeon: Larey Dresser, MD;  Location: [facility];  Service: Cardiovascular;  Laterality: N/A;  . TUBAL LIGATION     left ovary.   Marland Kitchen Uterine ablation      No Known Allergies  No current outpatient prescriptions on file prior to visit.   No current facility-administered medications on file prior to visit.        Objective:   Physical Exam Blood pressure (!) 160/110, pulse 84, temperature 97.7 F (36.5 C), height 5\' 1"  (1.549 m), weight 233 lb 1.6 oz (105.7 kg). Alert and oriented. Skin warm and dry. Oral mucosa is moist.   . Sclera anicteric, conjunctivae is pink. Thyroid not enlarged. No cervical lymphadenopathy. Lungs clear. Heart regular rate and rhythm.  Abdomen is soft. Bowel sounds are positive. No hepatomegaly. No abdominal masses felt. No  tenderness.  No edema to lower extremities.  .        Assessment & Plan:  Elevated liver enzymes. US abdomen/ ferritin, Hepatic function. If Hepatic elevated will rule out auto immune.

## 2016-08-07 NOTE — Patient Instructions (Signed)
OV in 3 months. 

## 2016-08-08 ENCOUNTER — Telehealth (INDEPENDENT_AMBULATORY_CARE_PROVIDER_SITE_OTHER): Payer: Self-pay | Admitting: Internal Medicine

## 2016-08-08 ENCOUNTER — Other Ambulatory Visit (INDEPENDENT_AMBULATORY_CARE_PROVIDER_SITE_OTHER): Payer: Self-pay | Admitting: *Deleted

## 2016-08-08 DIAGNOSIS — R748 Abnormal levels of other serum enzymes: Secondary | ICD-10-CM

## 2016-08-08 DIAGNOSIS — R7401 Elevation of levels of liver transaminase levels: Secondary | ICD-10-CM

## 2016-08-08 DIAGNOSIS — R740 Nonspecific elevation of levels of transaminase and lactic acid dehydrogenase [LDH]: Principal | ICD-10-CM

## 2016-08-08 LAB — FERRITIN: FERRITIN: 546 ng/mL — AB (ref 10–232)

## 2016-08-08 NOTE — Telephone Encounter (Signed)
Am going to order further test to rule out auto immune hepatitis.

## 2016-08-09 NOTE — Telephone Encounter (Signed)
error 

## 2016-08-10 LAB — IRON AND TIBC
%SAT: 31 % (ref 11–50)
IRON: 107 ug/dL (ref 40–190)
TIBC: 347 ug/dL (ref 250–450)
UIBC: 240 ug/dL (ref 125–400)

## 2016-08-11 LAB — PROTIME-INR
INR: 1
Prothrombin Time: 10.6 s (ref 9.0–11.5)

## 2016-08-11 LAB — IGG: IGG (IMMUNOGLOBIN G), SERUM: 761 mg/dL (ref 694–1618)

## 2016-08-11 LAB — SEDIMENTATION RATE: Sed Rate: 28 mm/hr — ABNORMAL HIGH (ref 0–20)

## 2016-08-11 LAB — ANA: Anti Nuclear Antibody(ANA): NEGATIVE

## 2016-08-14 ENCOUNTER — Ambulatory Visit (HOSPITAL_COMMUNITY)
Admission: RE | Admit: 2016-08-14 | Discharge: 2016-08-14 | Disposition: A | Discharge status: Home / Self Care | Payer: BLUE CROSS/BLUE SHIELD | Source: Ambulatory Visit | Attending: Internal Medicine | Admitting: Internal Medicine

## 2016-08-14 DIAGNOSIS — Z9049 Acquired absence of other specified parts of digestive tract: Secondary | ICD-10-CM | POA: Insufficient documentation

## 2016-08-14 DIAGNOSIS — K760 Fatty (change of) liver, not elsewhere classified: Secondary | ICD-10-CM | POA: Diagnosis not present

## 2016-08-14 DIAGNOSIS — R748 Abnormal levels of other serum enzymes: Secondary | ICD-10-CM | POA: Insufficient documentation

## 2016-08-14 LAB — ALPHA-1-ANTITRYPSIN: A1 ANTITRYPSIN SER: 211 mg/dL — AB (ref 83–199)

## 2016-08-14 LAB — ANTI-SMOOTH MUSCLE ANTIBODY, IGG

## 2016-08-14 LAB — MITOCHONDRIAL ANTIBODIES: Mitochondrial M2 Ab, IgG: <=20 Units (ref <=20.0)

## 2016-08-14 LAB — CERULOPLASMIN: Ceruloplasmin: 39 mg/dL (ref 18–53)

## 2016-08-15 ENCOUNTER — Telehealth (INDEPENDENT_AMBULATORY_CARE_PROVIDER_SITE_OTHER): Payer: Self-pay | Admitting: Internal Medicine

## 2016-08-15 DIAGNOSIS — R748 Abnormal levels of other serum enzymes: Secondary | ICD-10-CM

## 2016-08-15 NOTE — Telephone Encounter (Signed)
Hepatic function for next week.

## 2016-09-05 LAB — HEPATIC FUNCTION PANEL
ALBUMIN: 4.1 g/dL (ref 3.6–5.1)
ALT: 202 U/L — AB (ref 6–29)
AST: 186 U/L — ABNORMAL HIGH (ref 10–35)
Alkaline Phosphatase: 202 U/L — ABNORMAL HIGH (ref 33–115)
BILIRUBIN INDIRECT: 0.4 mg/dL (ref 0.2–1.2)
Bilirubin, Direct: 0.4 mg/dL — ABNORMAL HIGH (ref <=0.2)
TOTAL PROTEIN: 6.8 g/dL (ref 6.1–8.1)
Total Bilirubin: 0.8 mg/dL (ref 0.2–1.2)

## 2016-09-07 ENCOUNTER — Other Ambulatory Visit (INDEPENDENT_AMBULATORY_CARE_PROVIDER_SITE_OTHER): Payer: Self-pay | Admitting: *Deleted

## 2016-09-07 DIAGNOSIS — R748 Abnormal levels of other serum enzymes: Secondary | ICD-10-CM

## 2016-09-20 ENCOUNTER — Encounter (INDEPENDENT_AMBULATORY_CARE_PROVIDER_SITE_OTHER): Payer: Self-pay

## 2016-09-28 ENCOUNTER — Other Ambulatory Visit (INDEPENDENT_AMBULATORY_CARE_PROVIDER_SITE_OTHER): Payer: Self-pay | Admitting: *Deleted

## 2016-09-28 ENCOUNTER — Encounter (INDEPENDENT_AMBULATORY_CARE_PROVIDER_SITE_OTHER): Payer: Self-pay | Admitting: *Deleted

## 2016-09-28 DIAGNOSIS — R748 Abnormal levels of other serum enzymes: Secondary | ICD-10-CM

## 2017-03-08 DEATH — deceased

## 2017-09-19 IMAGING — CT CT ABD-PELV W/ CM
2 of 5 series · 10 of 46 positions shown, 11 images · IV contrast (Iodine)
Comparison: None.

CLINICAL DATA: 45-year-old presenting with acute onset of right
upper quadrant abdominal pain 2 days ago. Surgical history includes
cholecystectomy, endometrial of correlation and right
salpingo-oophorectomy.

EXAM:
CT ABDOMEN AND PELVIS WITH CONTRAST
TECHNIQUE: Multidetector CT imaging of the abdomen and pelvis was performed
using the standard protocol following bolus administration of
intravenous contrast.
CONTRAST:  100mL OMNIPAQUE IOHEXOL 300 MG/ML IV. Oral contrast was
not administered.

[Series 201: routine, idose (2) · axial · 0.85mm/px · z∈[+46,+431]mm · 7 of 101 slices shown, 8 images]
[im 12/101  soft-tissue]
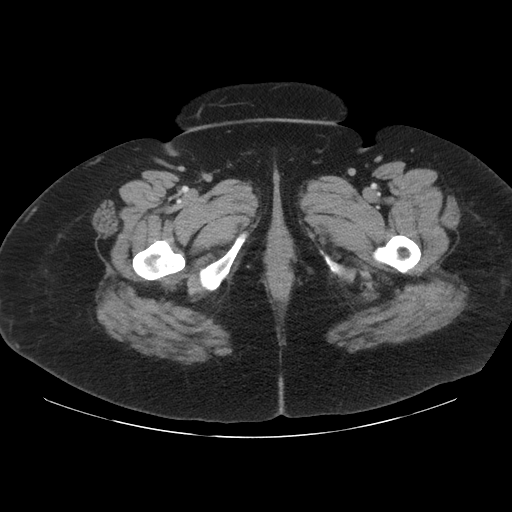
[im 12/101  bone]
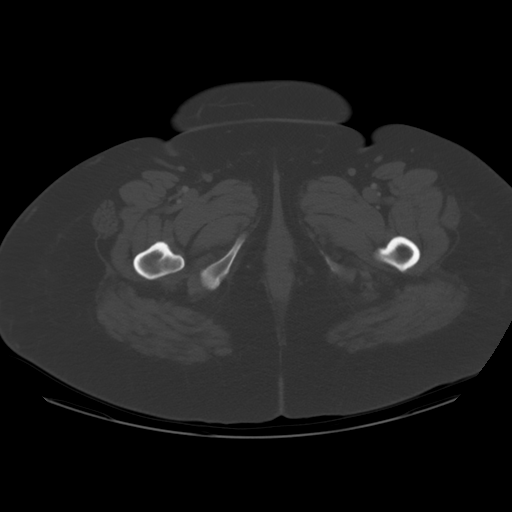
[im 23/101  soft-tissue]
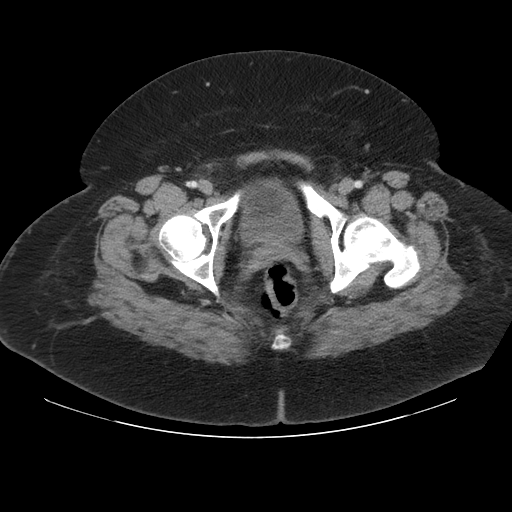
[im 39/101  soft-tissue]
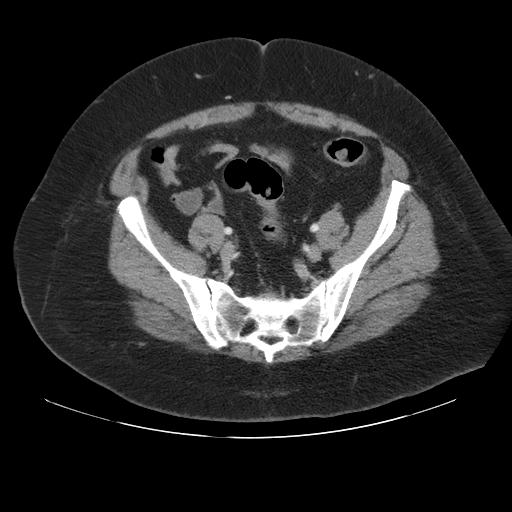
[im 51/101  soft-tissue]
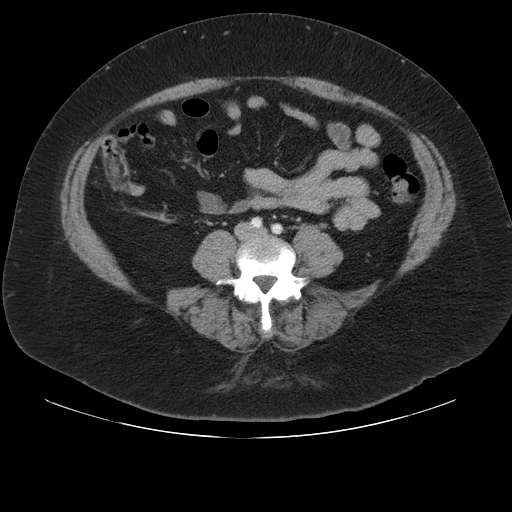
[im 62/101  soft-tissue]
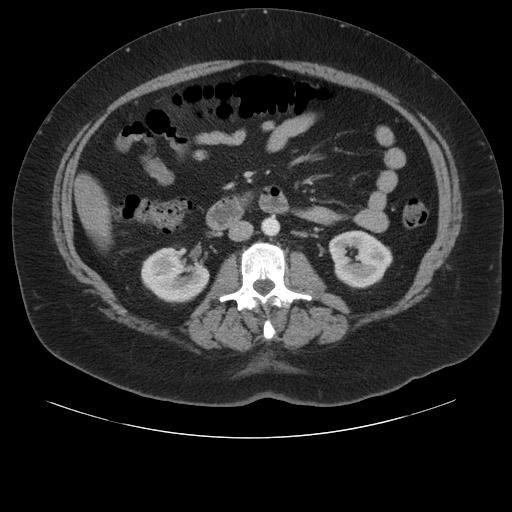
[im 78/101  soft-tissue]
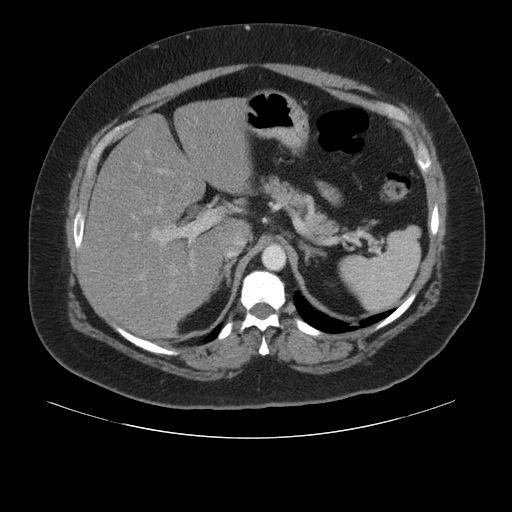
[im 89/101  soft-tissue]
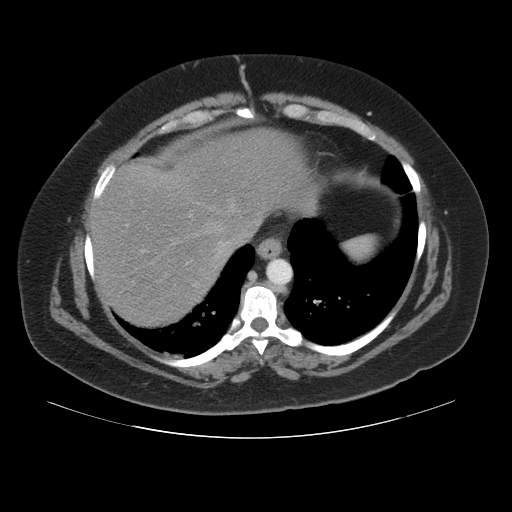

[Series 203: coronals, idose (2) · coronal · 0.45mm/px · 3 of 136 slices shown]
[im 46/136  soft-tissue]
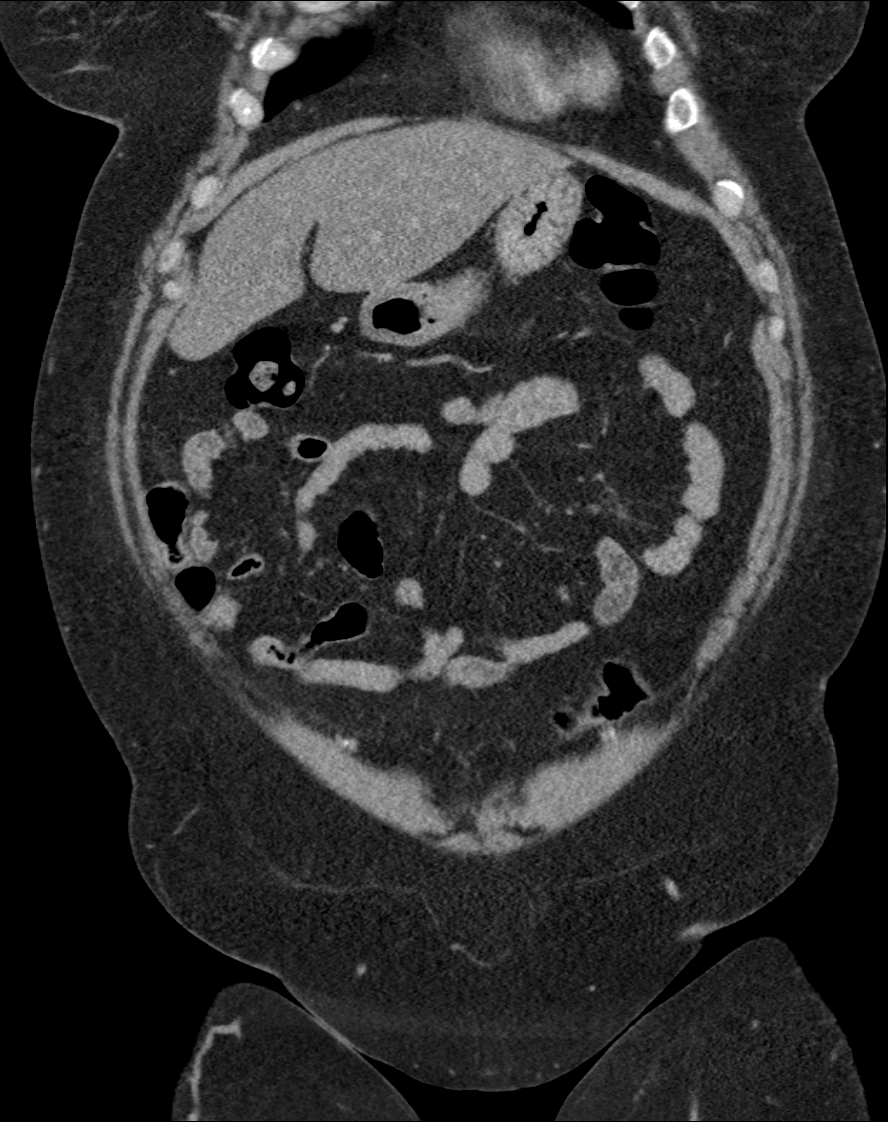
[im 61/136  soft-tissue]
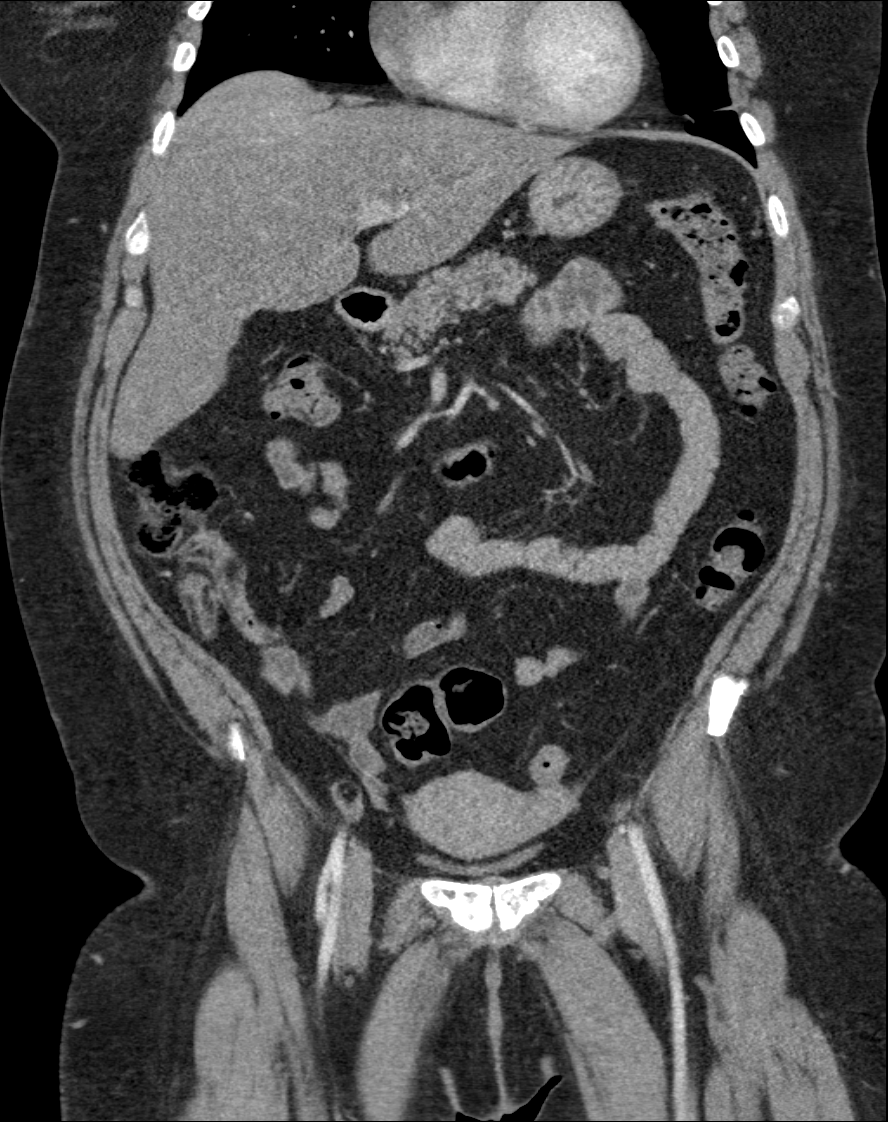
[im 76/136  soft-tissue]
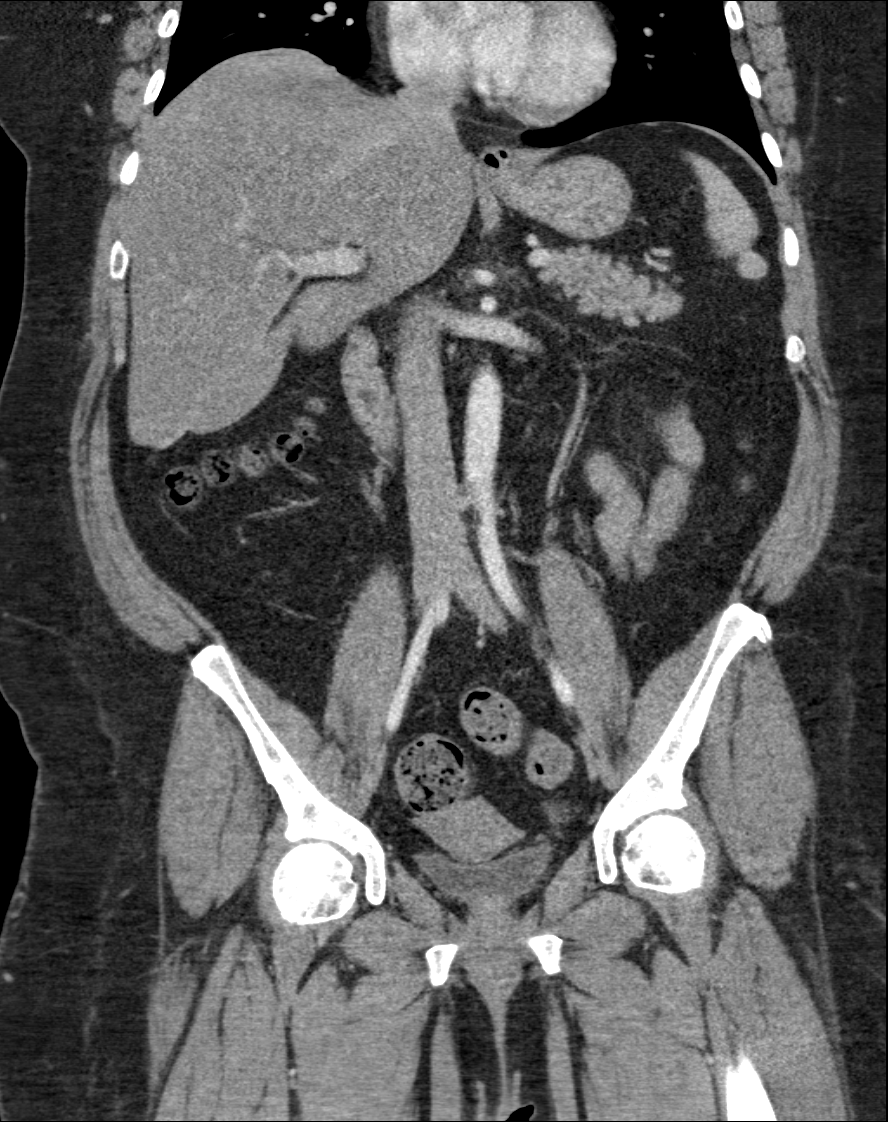

[10 of 46 positions shown; findings below may reference images not displayed]

FINDINGS: Lower chest: Linear scar or atelectasis in the left lower lobe.
Visualized lung bases otherwise clear. Normal heart size.

Hepatobiliary: Diffuse hepatic steatosis without focal hepatic
parenchymal abnormality. Gallbladder surgically absent. No biliary
ductal dilation.

Pancreas: Normal in appearance without evidence of mass, ductal
dilation, or inflammation.

Spleen: Normal in size and appearance.

Adrenals/Urinary Tract: Normal appearing adrenal glands. Kidneys
normal in size and appearance without focal parenchymal abnormality.
No evidence of urinary tract calculi or obstruction.
Normal-appearing decompressed urinary bladder.

Stomach/Bowel: Very small hiatal hernia. Stomach otherwise normal in
appearance. Normal-appearing small bowel. Entire colon relatively
decompressed with moderate stool burden. No evidence of colonic
diverticulosis. Mobile cecum in the scratch they completely
decompressed cecum positioned in the right mid abdomen. Normal
appendix in the right mid abdomen and right upper pelvis.

Vascular/Lymphatic: No visible aortoiliofemoral atherosclerosis.
Widely patent visceral arteries. Normal-appearing portal venous and
iliocaval venous systems. No pathologic lymphadenopathy.

Reproductive: Normal-appearing uterus and ovaries without evidence
of adnexal mass.

Other: None.

Musculoskeletal: Degenerative disc disease and spondylosis involving
the lower thoracic spine. Severe degenerative disc disease at L5-S1.
No acute abnormality.
IMPRESSION: 1. No acute abnormalities involving the abdomen or pelvis. Moderate
colonic stool burden.
2. Diffuse hepatic steatosis without focal hepatic parenchymal
abnormality.
3. Very small hiatal hernia.

## 2018-01-03 IMAGING — US US ABDOMEN COMPLETE
1 series · 14 of 25 positions shown · non-contrast
Comparison: 07/19/2016 CT.

CLINICAL DATA: 46-year-old female with elevated liver enzymes.
Postcholecystectomy. Subsequent encounter.

EXAM:
ABDOMEN ULTRASOUND COMPLETE

[Series 1: us abdomen complete · 0.26mm/px · 14 of 92 slices shown]
[im 1/92]
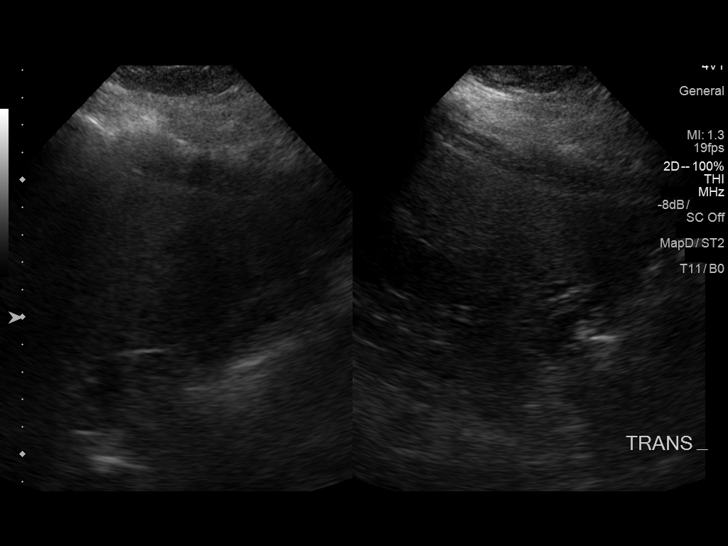
[im 8/92]
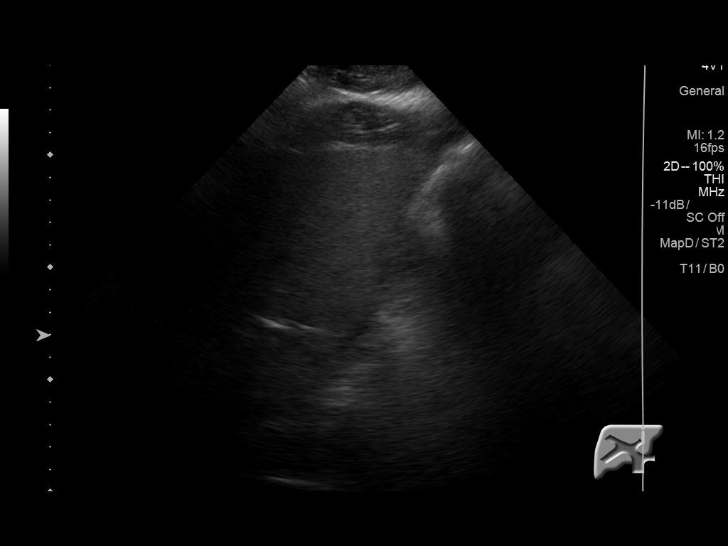
[im 16/92]
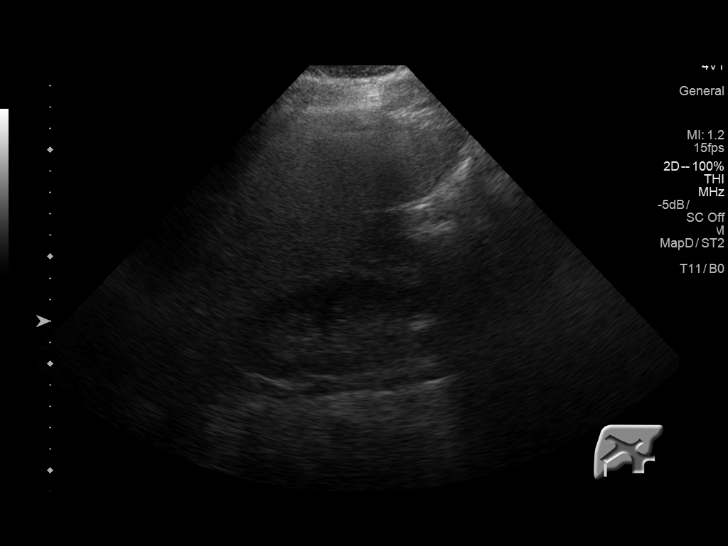
[im 23/92]
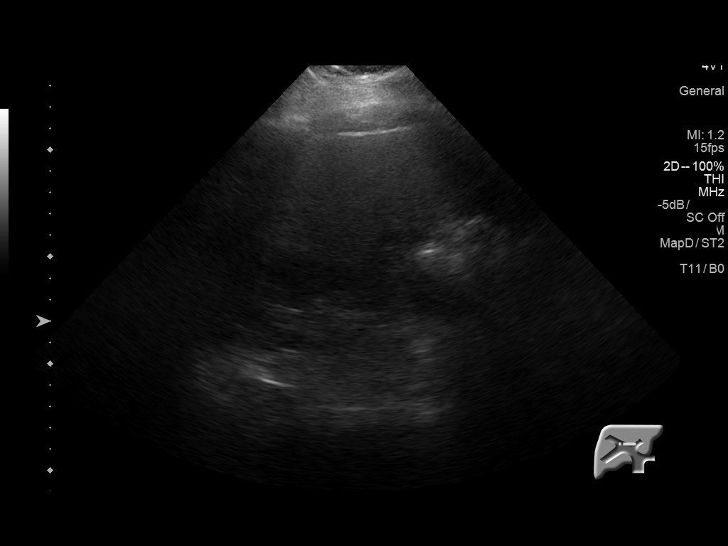
[im 31/92]
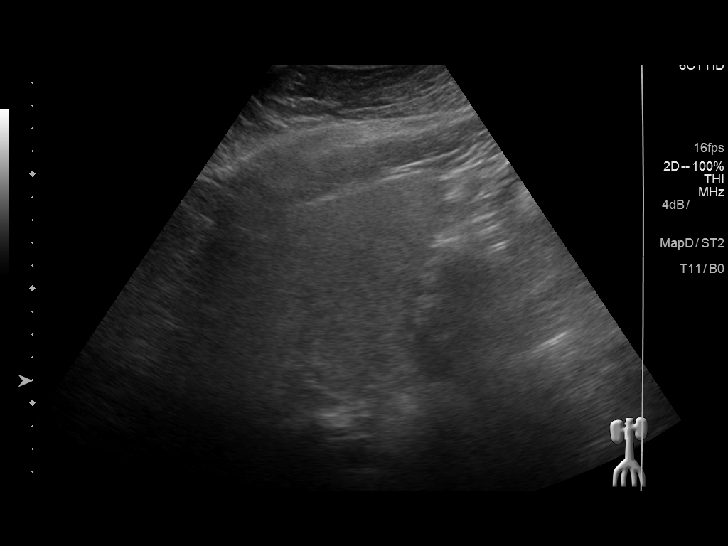
[im 35/92]
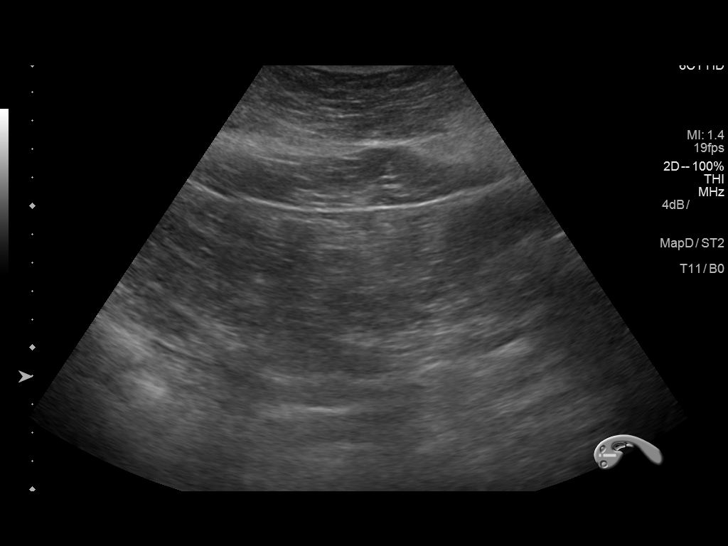
[im 42/92]
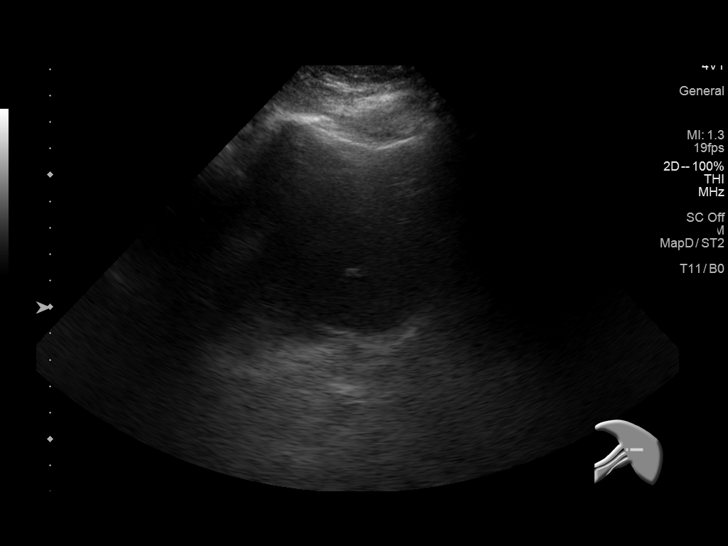
[im 50/92]
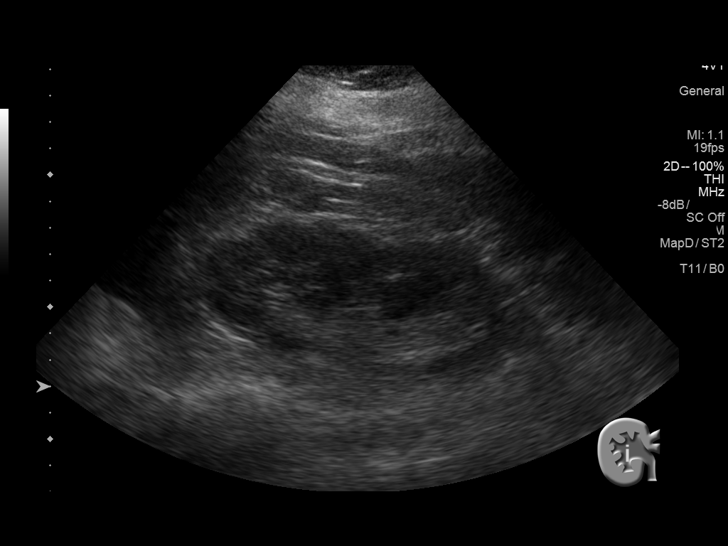
[im 57/92]
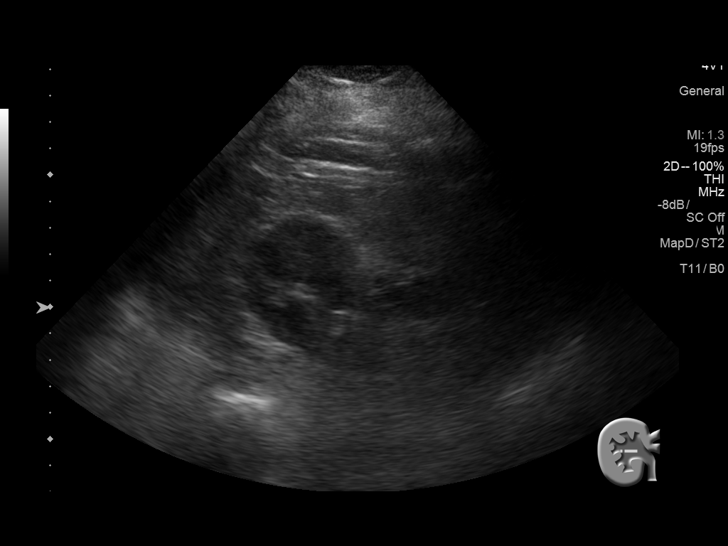
[im 61/92]
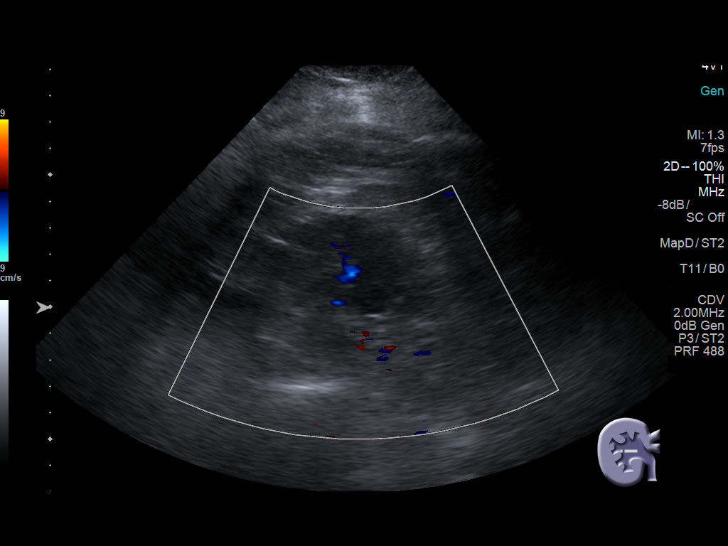
[im 69/92]
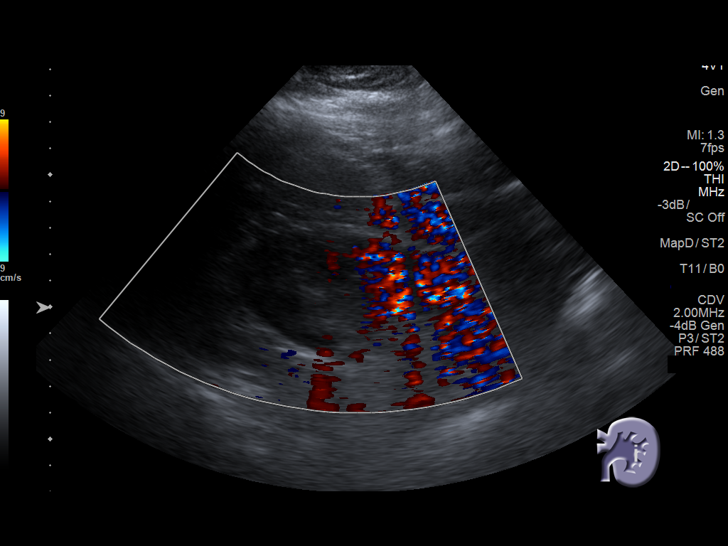
[im 76/92]
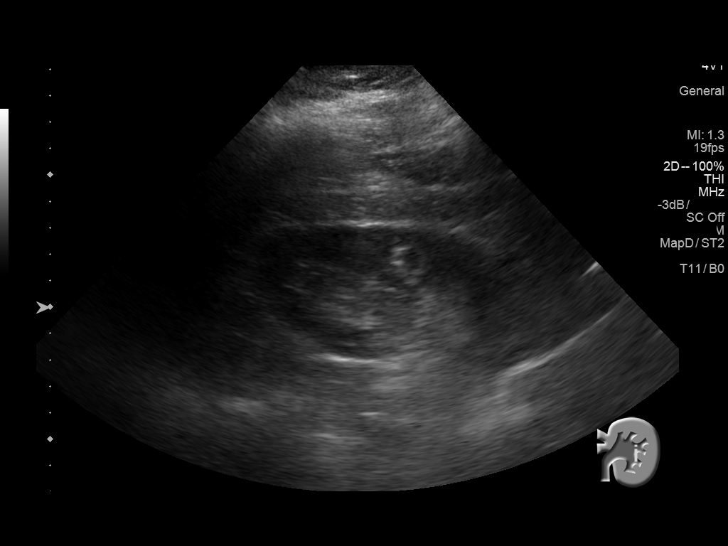
[im 84/92]
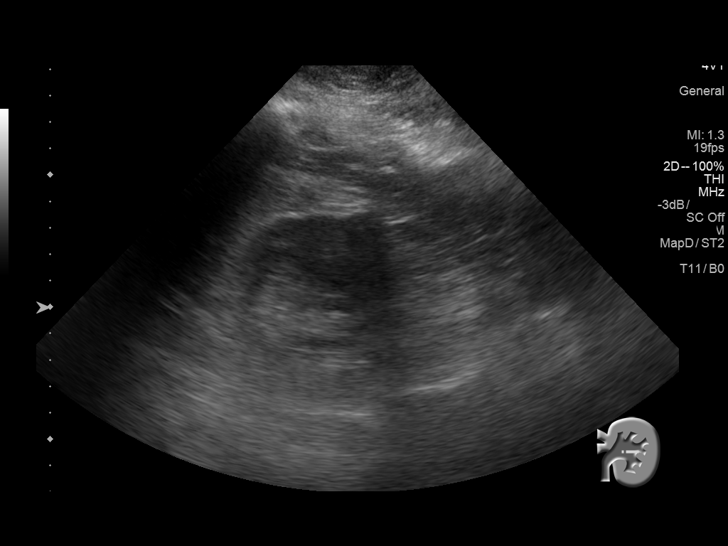
[im 92/92]
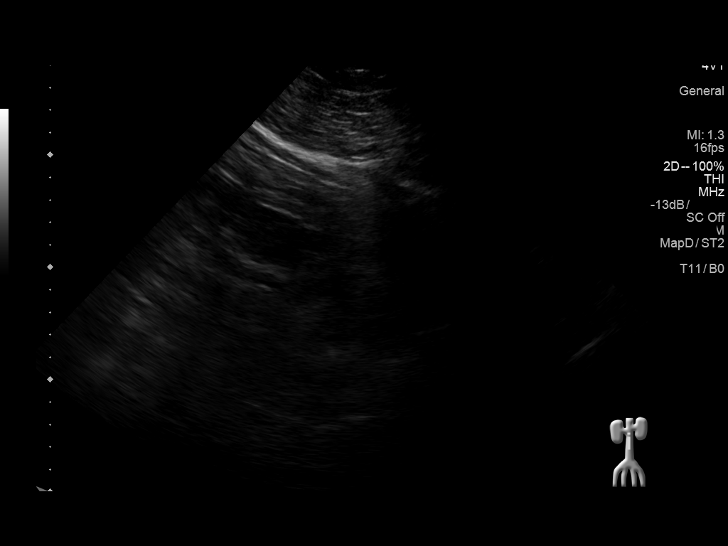

[14 of 25 positions shown; findings below may reference images not displayed]

FINDINGS: Gallbladder: Post cholecystectomy.

Common bile duct: Diameter: 5.7 mm.

Liver: Elongated fatty liver without focal mass or intrahepatic
biliary duct dilation.

IVC: Evaluation limited by bowel gas.

Pancreas: Evaluation limited by bowel gas.

Spleen: Size and appearance within normal limits.

Right Kidney: Length: 12.5 cm. Echogenicity within normal limits. No
mass or hydronephrosis visualized.

Left Kidney: Length: 11.3 cm. Echogenicity within normal limits. No
mass or hydronephrosis visualized.

Abdominal aorta: Evaluation limited by bowel gas. Portion visualized
measures up to 2.9 cm.

Other findings: None.
IMPRESSION: Elongated fatty liver without focal mass or intrahepatic biliary
duct dilation.

Post cholecystectomy.

Evaluation of inferior vena cava, pancreas and aorta limited by
bowel gas.
